# Patient Record
Sex: Female | Born: 1975 | Hispanic: No | Marital: Single | Smoking: Never smoker
Health system: Southern US, Community
[De-identification: ages and names within clinical notes are randomized; demographics above are authoritative.]

## PROBLEM LIST (undated history)

## (undated) DIAGNOSIS — K5090 Crohn's disease, unspecified, without complications: Secondary | ICD-10-CM

## (undated) DIAGNOSIS — D649 Anemia, unspecified: Secondary | ICD-10-CM

## (undated) HISTORY — DX: Crohn's disease, unspecified, without complications: K50.90

## (undated) HISTORY — PX: BREAST ENHANCEMENT SURGERY: SHX7

## (undated) HISTORY — DX: Anemia, unspecified: D64.9

## (undated) HISTORY — PX: OTHER SURGICAL HISTORY: SHX169

---

## 2020-02-02 ENCOUNTER — Encounter (HOSPITAL_BASED_OUTPATIENT_CLINIC_OR_DEPARTMENT_OTHER): Payer: Self-pay | Admitting: Emergency Medicine

## 2020-02-02 ENCOUNTER — Emergency Department (HOSPITAL_BASED_OUTPATIENT_CLINIC_OR_DEPARTMENT_OTHER): Payer: Self-pay

## 2020-02-02 ENCOUNTER — Emergency Department (HOSPITAL_BASED_OUTPATIENT_CLINIC_OR_DEPARTMENT_OTHER)
Admission: EM | Admit: 2020-02-02 | Discharge: 2020-02-02 | Disposition: A | Discharge status: Home / Self Care | Attending: Emergency Medicine | Admitting: Emergency Medicine

## 2020-02-02 ENCOUNTER — Other Ambulatory Visit: Payer: Self-pay

## 2020-02-02 DIAGNOSIS — M79601 Pain in right arm: Secondary | ICD-10-CM

## 2020-02-02 DIAGNOSIS — M79621 Pain in right upper arm: Secondary | ICD-10-CM | POA: Insufficient documentation

## 2020-02-02 MED ORDER — KETOROLAC TROMETHAMINE 60 MG/2ML IM SOLN
15.0000 mg | Freq: Once | INTRAMUSCULAR | Status: AC
  Administered 2020-02-02: 15 mg via INTRAMUSCULAR
  Filled 2020-02-02: qty 2

## 2020-02-02 NOTE — ED Triage Notes (Addendum)
Right sided elbow pain after getting bumped by forklift yesterday at work. States bruising, decreased strength. Aleve 6 hours ago

## 2020-02-02 NOTE — ED Provider Notes (Signed)
[facility] EMERGENCY DEPARTMENT Provider Note   CSN: [id number] Arrival date & time: 02/02/20  0145     History Chief Complaint  Patient presents with  . Arm Pain    Meagan Cooper is a 44 y.o. female.  Right elbow hit with some type of box that was hit by a forklift. Having paresthesias and numbness in area since then. Also with pain.  Better with compression.   Arm Pain This is a new problem. The current episode started 12 to 24 hours ago. The problem occurs constantly. The problem has been gradually worsening. Pertinent negatives include no abdominal pain and no headaches. Nothing aggravates the symptoms. Nothing relieves the symptoms. She has tried rest (compression) for the symptoms. The treatment provided mild relief.    History reviewed. No pertinent past medical history.  There are no problems to display for this patient.   History reviewed. No pertinent surgical history.   OB History   No obstetric history on file.     No family history on file.  Social History   Tobacco Use  . Smoking status: Never Smoker  . Smokeless tobacco: Never Used  Substance Use Topics  . Alcohol use: Not Currently  . Drug use: Not Currently    Home Medications Prior to Admission medications   Not on File    Allergies    Patient has no known allergies.  Review of Systems   Review of Systems  Gastrointestinal: Negative for abdominal pain.  Neurological: Negative for headaches.  All other systems reviewed and are negative.   Physical Exam Updated Vital Signs BP 130/83   Pulse 98   Temp 99.8 F (37.7 C) (Oral)   Resp 18   Ht 5\' 9"  (1.753 m)   Wt 59 kg   LMP 12/24/2019 (Approximate)   SpO2 100%   BMI 19.20 kg/m   Physical Exam Vitals and nursing note reviewed.  Constitutional:      Appearance: She is well-developed.  HENT:     Head: Normocephalic and atraumatic.     Mouth/Throat:     Mouth: Mucous membranes are dry.     Pharynx: Oropharynx is  clear.  Eyes:     Conjunctiva/sclera: Conjunctivae normal.  Cardiovascular:     Rate and Rhythm: Normal rate and regular rhythm.  Pulmonary:     Effort: No respiratory distress.     Breath sounds: No stridor.  Abdominal:     General: There is no distension.  Musculoskeletal:     Cervical back: Normal range of motion.  Skin:    General: Skin is warm and dry.     Findings: Bruising (right distal humerus posterior with small area of abrasion) present.  Neurological:     Mental Status: She is alert.     Comments: Good sensation. Good grip strength. Good tricep extension, bicep flexion.      ED Results / Procedures / Treatments   Labs (all labs ordered are listed, but only abnormal results are displayed) Labs Reviewed - No data to display  EKG None  Radiology DG Elbow Complete Right  Result Date: 02/02/2020 CLINICAL DATA:  Right elbow pain.  Evaluate for fracture. EXAM: RIGHT ELBOW - COMPLETE 3+ VIEW COMPARISON:  None. FINDINGS: There is no evidence of fracture, dislocation, or joint effusion. There is no evidence of arthropathy or other focal bone abnormality. Soft tissues are unremarkable. IMPRESSION: Negative radiographs of the right elbow. Electronically Signed   By: Keith Rake M.D.   On: 02/02/2020  02:30    Procedures Procedures (including critical care time)  Medications Ordered in ED Medications  ketorolac (TORADOL) injection 15 mg (15 mg Intramuscular Given 02/02/20 0250)    ED Course  I have reviewed the triage vital signs and the nursing notes.  Pertinent labs & imaging results that were available during my care of the patient were reviewed by me and considered in my medical decision making (see chart for details).    MDM Rules/Calculators/A&P                      Suspect area of hematoma is causing peripheral (ulnar) nerve irritation/compression causing discomfort. No e/o cva. Good strength and sensation, doubt nerve injury. Plan for supportive care,  light duty for a week, follow up for return to work.  Final Clinical Impression(s) / ED Diagnoses Final diagnoses:  Right arm pain    Rx / DC Orders ED Discharge Orders    None       Kenny Stern, Barbara Cower, MD 02/02/20 0320

## 2020-02-15 ENCOUNTER — Ambulatory Visit: Payer: Self-pay

## 2020-02-15 ENCOUNTER — Ambulatory Visit (INDEPENDENT_AMBULATORY_CARE_PROVIDER_SITE_OTHER): Admitting: Family Medicine

## 2020-02-15 ENCOUNTER — Other Ambulatory Visit: Payer: Self-pay

## 2020-02-15 DIAGNOSIS — M7711 Lateral epicondylitis, right elbow: Secondary | ICD-10-CM

## 2020-02-15 DIAGNOSIS — S5000XA Contusion of unspecified elbow, initial encounter: Secondary | ICD-10-CM | POA: Insufficient documentation

## 2020-02-15 DIAGNOSIS — M25521 Pain in right elbow: Secondary | ICD-10-CM

## 2020-02-15 DIAGNOSIS — M7710 Lateral epicondylitis, unspecified elbow: Secondary | ICD-10-CM | POA: Insufficient documentation

## 2020-02-15 DIAGNOSIS — S5001XA Contusion of right elbow, initial encounter: Secondary | ICD-10-CM | POA: Diagnosis not present

## 2020-02-15 MED ORDER — DICLOFENAC SODIUM 1 % EX GEL: 2.0000 g | Freq: Four times a day (QID) | CUTANEOUS | 11 refills | Status: DC

## 2020-02-15 MED ORDER — PREDNISONE 5 MG PO TABS: ORAL_TABLET | ORAL | 0 refills | Status: DC

## 2020-02-15 NOTE — Patient Instructions (Signed)
Nice to meet you Please try the exercises  Please try the voltaren  You can try heat or ice  Please send me a message in MyChart with any questions or updates.  Please see me back in 4 weeks.   --Dr. Jordan Likes

## 2020-02-15 NOTE — Assessment & Plan Note (Addendum)
Injury occurred after an accident at work.  No structural changes appreciated on ultrasound or x-ray today.  Possible for bony contusion or exacerbation of epdicondylitis.  Has good range of motion. -Counseled on home exercise therapy and supportive care. -Prednisone -Voltaren. -If no improvement will consider injection or physical therapy.

## 2020-02-15 NOTE — Progress Notes (Addendum)
  Meagan Cooper - 44 y.o. female MRN [id number]  Date of birth: [birth year]  SUBJECTIVE:  Including CC & ROS.  No chief complaint on file.   Meagan Cooper is a 44 y.o. female that is presenting with right elbow pain.  She was at work at the [employer] and a shelf hit her elbow.  She had significant pain over this aspect.  She was seen in the emergency department and placed in a sling.  She feels like the sling is hurting.  No medicines have really improved her pain at this point..  Independent review of the right elbow x-ray from 4/23 shows no acute changes.   Review of Systems See HPI   HISTORY: Past Medical, Surgical, Social, and Family History Reviewed & Updated per EMR.   Pertinent Historical Findings include:  No past medical history on file.  No past surgical history on file.  No family history on file.  Social History   Socioeconomic History  . Marital status: Cooper    Spouse name: Not on file  . Number of children: Not on file  . Years of education: Not on file  . Highest education level: Not on file  Occupational History  . Not on file  Tobacco Use  . Smoking status: Never Smoker  . Smokeless tobacco: Never Used  Substance and Sexual Activity  . Alcohol use: Not Currently  . Drug use: Not Currently  . Sexual activity: Not on file  Other Topics Concern  . Not on file  Social History Narrative  . Not on file   Social Determinants of Health   Financial Resource Strain: Not on file  Food Insecurity: Not on file  Transportation Needs: Not on file  Physical Activity: Not on file  Stress: Not on file  Social Connections: Not on file  Intimate Partner Violence: Not on file     PHYSICAL EXAM:  VS: There were no vitals taken for this visit. Physical Exam Gen: NAD, alert, cooperative with exam, well-appearing MSK:  Right elbow: No obvious effusion. Normal range of motion. Normal supination pronation. Tenderness palpation over the lateral joint line. No  redness. Neurovascularly intact  Limited ultrasound: Right elbow:  No effusion within the joint appreciated. Normal-appearing lateral epicondyle. No changes at the radial head. No changes at the triceps insertion. No changes of the olecranon.  Summary: No structural changes appreciated  Ultrasound and interpretation by Clare Gandy, MD    ASSESSMENT & PLAN:   Lateral epicondylitis Injury occurred after an accident at work.  No structural changes appreciated on ultrasound or x-ray today.  Possible for bony contusion or exacerbation of epdicondylitis.  Has good range of motion. -Counseled on home exercise therapy and supportive care. -Prednisone -Voltaren. -If no improvement will consider injection or physical therapy.

## 2020-03-06 ENCOUNTER — Encounter: Payer: Self-pay | Admitting: Family Medicine

## 2020-03-14 ENCOUNTER — Encounter: Payer: Self-pay | Admitting: Family Medicine

## 2020-03-18 ENCOUNTER — Encounter: Payer: Self-pay | Admitting: Family Medicine

## 2020-03-18 ENCOUNTER — Other Ambulatory Visit: Payer: Self-pay

## 2020-03-18 ENCOUNTER — Ambulatory Visit (INDEPENDENT_AMBULATORY_CARE_PROVIDER_SITE_OTHER): Admitting: Family Medicine

## 2020-03-18 VITALS — BP 120/78 | HR 76 | Ht 69.0 in | Wt 130.0 lb

## 2020-03-18 DIAGNOSIS — M7711 Lateral epicondylitis, right elbow: Secondary | ICD-10-CM | POA: Diagnosis not present

## 2020-03-18 DIAGNOSIS — M25521 Pain in right elbow: Secondary | ICD-10-CM | POA: Diagnosis not present

## 2020-03-18 DIAGNOSIS — S5001XD Contusion of right elbow, subsequent encounter: Secondary | ICD-10-CM

## 2020-03-18 MED ORDER — GABAPENTIN 100 MG PO CAPS: 100.0000 mg | ORAL_CAPSULE | Freq: Three times a day (TID) | ORAL | 1 refills | Status: DC | PRN

## 2020-03-18 NOTE — Assessment & Plan Note (Signed)
Pain is secondary to an injury sustained at work.  Symptoms are ongoing.  A possible for radial nerve versus ulnar nerve involvement.  She was hit on the lateral aspect of the elbow. -Counseled on home exercise therapy and supportive care. -MRI to evaluate for possible ligamentous or neurovascular changes. -Provided work note.

## 2020-03-18 NOTE — Patient Instructions (Signed)
Good to see you Please try the gabapentin. This may make you sleepy. Please start with one pill at night. You can increase to 2 or 3 pills per day as you tolerate   Please send me a message in MyChart with any questions or updates.  We will set up a virtual visit once the MRI is resulted.   --Dr. Jordan Likes

## 2020-03-18 NOTE — Progress Notes (Addendum)
  Para Cossey - 44 y.o. female MRN [id number]  Date of birth: December 23, 1975  SUBJECTIVE:  Including CC & ROS.  Chief Complaint  Patient presents with  . Follow-up    right elbow    Meagan Cooper is a 44 y.o. female that is presenting with worsening of her right elbow pain.  Her injury stems from an accident that occurred at work where her right elbow was struck with a shelf.  Imaging thus far has been negative.  The pain is occurring over the posterior aspect as well as the lateral aspect.  Having some sensational changes in the pinky finger.   Review of Systems See HPI   HISTORY: Past Medical, Surgical, Social, and Family History Reviewed & Updated per EMR.   Pertinent Historical Findings include:  No past medical history on file.  No past surgical history on file.  No family history on file.  Social History   Socioeconomic History  . Marital status: Single    Spouse name: Not on file  . Number of children: Not on file  . Years of education: Not on file  . Highest education level: Not on file  Occupational History  . Not on file  Tobacco Use  . Smoking status: Never Smoker  . Smokeless tobacco: Never Used  Substance and Sexual Activity  . Alcohol use: Not Currently  . Drug use: Not Currently  . Sexual activity: Not on file  Other Topics Concern  . Not on file  Social History Narrative  . Not on file   Social Determinants of Health   Financial Resource Strain: Not on file  Food Insecurity: Not on file  Transportation Needs: Not on file  Physical Activity: Not on file  Stress: Not on file  Social Connections: Not on file  Intimate Partner Violence: Not on file     PHYSICAL EXAM:  VS: BP 120/78   Pulse 76   Ht 5\' 9"  (1.753 m)   Wt 130 lb (59 kg)   BMI 19.20 kg/m  Physical Exam Gen: NAD, alert, cooperative with exam, well-appearing MSK:  Right elbow: Normal range of motion. Tenderness to palpation over the lateral joint space. Positive Tinel's at the  cubital tunnel. Tenderness palpation over the radial nerve at the elbow. Normal grip strength. Normal finger abduction and adduction  Normal pincer grasp. Normal wrist strength to flexion extension. Neurovascularly intact     ASSESSMENT & PLAN:   Lateral epicondylitis Pain is secondary to an injury sustained at work.  Symptoms are ongoing.  A possible for radial nerve versus ulnar nerve involvement.  She was hit on the lateral aspect of the elbow. -Counseled on home exercise therapy and supportive care. -MRI to evaluate for possible ligamentous or neurovascular changes. -Provided work note.

## 2020-03-25 ENCOUNTER — Telehealth: Payer: Self-pay | Admitting: Family Medicine

## 2020-03-25 NOTE — Telephone Encounter (Signed)
Patient calling regarding the gabapenitn that was prescribed. She states the medication makes her drowsy and nauseas and has caused her eye to twitch. Patient asking if there is anything else that can be prescribed.

## 2020-03-26 MED ORDER — NAPROXEN 500 MG PO TABS: 500.0000 mg | ORAL_TABLET | Freq: Two times a day (BID) | ORAL | 1 refills | Status: AC | PRN

## 2020-03-26 NOTE — Telephone Encounter (Signed)
She did not tolerate the gabapentin. Will send in naproxen.   Myra Rude, MD [facility] 03/26/2020, 8:22 AM

## 2020-04-10 NOTE — Addendum Note (Signed)
Addended by: Kathi Simpers F on: 04/10/2020 04:38 PM   Modules accepted: Orders

## 2020-04-22 ENCOUNTER — Ambulatory Visit
Admission: RE | Admit: 2020-04-22 | Discharge: 2020-04-22 | Disposition: A | Discharge status: Home / Self Care | Payer: Self-pay | Source: Ambulatory Visit | Attending: Family Medicine | Admitting: Family Medicine

## 2020-04-22 ENCOUNTER — Other Ambulatory Visit: Payer: Self-pay

## 2020-04-22 DIAGNOSIS — M25521 Pain in right elbow: Secondary | ICD-10-CM

## 2020-05-02 ENCOUNTER — Telehealth: Payer: Self-pay | Admitting: *Deleted

## 2020-05-02 NOTE — Telephone Encounter (Signed)
Called patient to inform her that Dr Jordan Likes was on vacation and that Dr Margaretha Sheffield looked at her MRI. Informed her MRI showed she has tennis elbow with partial tearing of the tendon. Told patientshe will need to keep her follow-up appt with Dr. Jordan Likes next thurs 7/29 to discuss next steps. Patient agreed to do so.

## 2020-05-09 ENCOUNTER — Telehealth: Payer: Self-pay | Admitting: Family Medicine

## 2020-10-17 DIAGNOSIS — Z131 Encounter for screening for diabetes mellitus: Secondary | ICD-10-CM | POA: Diagnosis not present

## 2020-10-17 DIAGNOSIS — Z0000 Encounter for general adult medical examination without abnormal findings: Secondary | ICD-10-CM | POA: Diagnosis not present

## 2020-10-17 DIAGNOSIS — K5090 Crohn's disease, unspecified, without complications: Secondary | ICD-10-CM | POA: Diagnosis not present

## 2020-10-17 DIAGNOSIS — Z13220 Encounter for screening for lipoid disorders: Secondary | ICD-10-CM | POA: Diagnosis not present

## 2020-10-24 ENCOUNTER — Telehealth: Payer: Self-pay | Admitting: Family Medicine

## 2020-10-24 NOTE — Telephone Encounter (Signed)
Patient called states she received a letter frm the [employer] regarding her WC claim pmts about to be retracked due to a Dx of Pain (not being acceptable)-- Pt request provider review & amend Dx for DOS.  --Forwarding request to provider (a printed copy of DOL (dept of Labor )  Denial letter placed in provider basket for review   --glh

## 2020-10-25 ENCOUNTER — Telehealth: Payer: Self-pay | Admitting: Family Medicine

## 2020-10-25 NOTE — Telephone Encounter (Signed)
Patient advised provider amended records--glh

## 2020-10-25 NOTE — Telephone Encounter (Signed)
Completed paperwork.   Myra Rude, MD [facility] 10/25/2020, 9:40 AM

## 2020-11-08 ENCOUNTER — Ambulatory Visit (INDEPENDENT_AMBULATORY_CARE_PROVIDER_SITE_OTHER): Payer: Federal, State, Local not specified - PPO | Admitting: Family Medicine

## 2020-11-08 ENCOUNTER — Other Ambulatory Visit: Payer: Self-pay

## 2020-11-08 DIAGNOSIS — M7711 Lateral epicondylitis, right elbow: Secondary | ICD-10-CM

## 2020-11-08 NOTE — Progress Notes (Signed)
  Meagan Cooper - 45 y.o. female MRN [id number]  Date of birth: [birth year]  SUBJECTIVE:  Including CC & ROS.  No chief complaint on file.   Meagan Cooper is a 45 y.o. female that is following up for her injury she sustained at work.  She has imaging performed.  She has had ongoing pain since her initial injury.   Review of Systems See HPI   HISTORY: Past Medical, Surgical, Social, and Family History Reviewed & Updated per EMR.   Pertinent Historical Findings include:  No past medical history on file.  No past surgical history on file.  No family history on file.  Social History   Socioeconomic History  . Marital status: Single    Spouse name: Not on file  . Number of children: Not on file  . Years of education: Not on file  . Highest education level: Not on file  Occupational History  . Not on file  Tobacco Use  . Smoking status: Never Smoker  . Smokeless tobacco: Never Used  Substance and Sexual Activity  . Alcohol use: Not Currently  . Drug use: Not Currently  . Sexual activity: Not on file  Other Topics Concern  . Not on file  Social History Narrative  . Not on file   Social Determinants of Health   Financial Resource Strain: Not on file  Food Insecurity: Not on file  Transportation Needs: Not on file  Physical Activity: Not on file  Stress: Not on file  Social Connections: Not on file  Intimate Partner Violence: Not on file     PHYSICAL EXAM:  VS: BP 126/78   Ht 5\' 9"  (1.753 m)   Wt 135 lb (61.2 kg)   BMI 19.94 kg/m  Physical Exam Gen: NAD, alert, cooperative with exam, well-appearing   ASSESSMENT & PLAN:   Lateral epicondylitis Pain secondary to an injury at work.  Imaging has showed changes at the lateral epicondyle.  Still having ongoing pain. -Counseled on home exercise therapy and supportive care. -Could consider injection, physical therapy or referral for surgery.

## 2020-11-08 NOTE — Assessment & Plan Note (Signed)
Pain secondary to an injury at work.  Imaging has showed changes at the lateral epicondyle.  Still having ongoing pain. -Counseled on home exercise therapy and supportive care. -Could consider injection, physical therapy or referral for surgery.

## 2020-11-10 ENCOUNTER — Encounter: Payer: Self-pay | Admitting: Family Medicine

## 2020-11-12 ENCOUNTER — Encounter: Payer: Self-pay | Admitting: Family Medicine

## 2020-12-13 DIAGNOSIS — Z01419 Encounter for gynecological examination (general) (routine) without abnormal findings: Secondary | ICD-10-CM | POA: Diagnosis not present

## 2020-12-13 DIAGNOSIS — Z1231 Encounter for screening mammogram for malignant neoplasm of breast: Secondary | ICD-10-CM | POA: Diagnosis not present

## 2020-12-13 DIAGNOSIS — N920 Excessive and frequent menstruation with regular cycle: Secondary | ICD-10-CM | POA: Diagnosis not present

## 2020-12-13 DIAGNOSIS — Z6822 Body mass index (BMI) 22.0-22.9, adult: Secondary | ICD-10-CM | POA: Diagnosis not present

## 2020-12-18 ENCOUNTER — Other Ambulatory Visit (INDEPENDENT_AMBULATORY_CARE_PROVIDER_SITE_OTHER): Payer: Federal, State, Local not specified - PPO

## 2020-12-18 ENCOUNTER — Encounter: Payer: Self-pay | Admitting: Gastroenterology

## 2020-12-18 ENCOUNTER — Ambulatory Visit (INDEPENDENT_AMBULATORY_CARE_PROVIDER_SITE_OTHER): Payer: Federal, State, Local not specified - PPO | Admitting: Gastroenterology

## 2020-12-18 VITALS — BP 94/60 | HR 80 | Ht 68.0 in | Wt 150.2 lb

## 2020-12-18 DIAGNOSIS — K5909 Other constipation: Secondary | ICD-10-CM

## 2020-12-18 DIAGNOSIS — Z1211 Encounter for screening for malignant neoplasm of colon: Secondary | ICD-10-CM | POA: Diagnosis not present

## 2020-12-18 DIAGNOSIS — R140 Abdominal distension (gaseous): Secondary | ICD-10-CM

## 2020-12-18 DIAGNOSIS — K649 Unspecified hemorrhoids: Secondary | ICD-10-CM

## 2020-12-18 DIAGNOSIS — R1084 Generalized abdominal pain: Secondary | ICD-10-CM

## 2020-12-18 DIAGNOSIS — Z8719 Personal history of other diseases of the digestive system: Secondary | ICD-10-CM

## 2020-12-18 LAB — CBC
HCT: 36.9 % (ref 36.0–46.0)
Hemoglobin: 11.9 g/dL — ABNORMAL LOW (ref 12.0–15.0)
MCHC: 32.3 g/dL (ref 30.0–36.0)
MCV: 80.7 fl (ref 78.0–100.0)
Platelets: 263 10*3/uL (ref 150.0–400.0)
RBC: 4.57 Mil/uL (ref 3.87–5.11)
RDW: 14 % (ref 11.5–15.5)
WBC: 5.8 10*3/uL (ref 4.0–10.5)

## 2020-12-18 LAB — BASIC METABOLIC PANEL
BUN: 9 mg/dL (ref 6–23)
CO2: 30 mEq/L (ref 19–32)
Calcium: 9.2 mg/dL (ref 8.4–10.5)
Chloride: 104 mEq/L (ref 96–112)
Creatinine, Ser: 0.72 mg/dL (ref 0.40–1.20)
GFR: 100.55 mL/min (ref >60.00)
Glucose, Bld: 80 mg/dL (ref 70–99)
Potassium: 3.8 mEq/L (ref 3.5–5.1)
Sodium: 137 mEq/L (ref 135–145)

## 2020-12-18 LAB — SEDIMENTATION RATE: Sed Rate: 23 mm/hr — ABNORMAL HIGH (ref 0–20)

## 2020-12-18 LAB — TSH: TSH: 0.64 u[IU]/mL (ref 0.35–4.50)

## 2020-12-18 LAB — HIGH SENSITIVITY CRP: CRP, High Sensitivity: 0.43 mg/L (ref 0.000–5.000)

## 2020-12-18 MED ORDER — PLENVU 140 G PO SOLR: 1.0000 | ORAL | 0 refills | Status: AC

## 2020-12-18 NOTE — Progress Notes (Signed)
[place] VISIT   Primary Care Provider Marco Collie, [place] [id number] [phone]  Referring Provider Marco Collie, MD [address] [place],  [place] [address] [phone]  Patient Profile: Meagan Cooper is a 45 y.o. female with a pmh significant for ?Crohn's disease, chronic anemia and chronic constipation.  The patient presents to the [facility] for an evaluation and management of problem(s) noted below:  Problem List 1. Unclear history of Crohn's disease   2. Chronic constipation   3. Colon cancer screening   4. Generalized abdominal pain   5. Bloating symptom   6. Hemorrhoids, unspecified hemorrhoid type     History of Present Illness This is the patient's first visit to the outpatient [facility] clinic.  The patient reports a prior history in the [place] of being diagnosed with Crohn's disease.  She says that since her early 76s, she was dealing with a possible diagnosis of irritable bowel syndrome-constipation.  She tried different fibers as well as stool softeners and eventually to the laxatives in an effort of trying to help her pass a bowel movement.  She currently takes MiraLAX as well as Ex-Lax every 3 to 4 days in order to try and have a bowel movement.  She says at 1 point in time she developed a bowel obstruction which eventually led to a colonoscopy which was reported as normal but she was given a diagnosis of Crohn's disease.  She remembers being initiated on an oral medication, she believes Amitiza, for the treatment of her Crohn's disease.  She lost her insurance and stopped taking this medication.  She has not seen GI in many years but reports a colonoscopy within the last 5 years..  Has experienced no unintentional weight loss.  She has episodes of nausea as well as vomiting.  She has had abdominal bloating, which occurs more often towards the third or fourth  day that she has not had a bowel movement.  Having a bowel movement does relieve her discomfort but does not relieve it completely every time.  She has had notation of hemorrhoids as well maternal grandmother who has a history of Crohn's disease and her daughter has Hirschsprung's disease.  Irritable bowel syndrome does run in her family.  The patient does not take nonsteroidals on an as-needed basis but not daily.  The patient has never had an upper endoscopy.  She does experience joint pains in elbows as well as her other joints but not specifically in her PIP or DIP joints.  She has never seen a rheumatologist.  She believes that these are a result of her underlying Crohn's disease, even though she is experiencing issues with chronic constipation.  She has never seen a rheumatologist.  GI Review of Systems Positive as above Negative for dysphagia, odynophagia, change in bowel habits, melena, hematochezia  Review of Systems General: Denies fevers/chills HEENT: Denies oral lesions Cardiovascular: Denies chest pain/palpitations Pulmonary: Denies shortness of breath Gastroenterological: See HPI Genitourinary: Denies darkened urine or hematuria Hematological: Denies easy bruising/bleeding Endocrine: Denies temperature intolerance Dermatological: Denies jaundice Psychological: Mood is stable but hopeful that we can understand her symptoms further Musculoskeletal: Denies new arthralgias though has recurrent chronic arthralgias   Medications Current Outpatient Medications  Medication Sig Dispense Refill  . naproxen (NAPROSYN) 500 MG tablet Take 1 tablet (500 mg total) by mouth 2 (two) times daily as needed. 60 tablet 1  . norethindrone (MICRONOR) 0.35 MG tablet Take  1 tablet by mouth daily.    Marland Kitchen PEG-KCl-NaCl-NaSulf-Na Asc-C (PLENVU) 140 g SOLR Take 1 kit by mouth as directed. Use coupon: BIN: [id number] PNC: CNRX Group: [id number] ID: [id number] 1 each 0  . predniSONE (DELTASONE) 5 MG tablet Take 6  pills for first day, 5 pills second day, 4 pills third day, 3 pills fourth day, 2 pills the fifth day, and 1 pill sixth day. 21 tablet 0  . diclofenac Sodium (VOLTAREN) 1 % GEL Apply 2 g topically 4 (four) times daily. To affected joint. 100 g 11   No current facility-administered medications for this visit.    Allergies No Known Allergies  Histories Past Medical History:  Diagnosis Date  . Anemia   . Crohn's disease [facility])    Past Surgical History:  Procedure Laterality Date  . BREAST ENHANCEMENT SURGERY Bilateral   . CESAREAN SECTION     x 2  . TUMMY TUCK     Social History   Socioeconomic History  . Marital status: Single    Spouse name: Not on file  . Number of children: 8  . Years of education: Not on file  . Highest education level: Not on file  Occupational History  . Occupation: Tour manager  Tobacco Use  . Smoking status: Never Smoker  . Smokeless tobacco: Never Used  Vaping Use  . Vaping Use: Never used  Substance and Sexual Activity  . Alcohol use: Yes    Comment: occasional  . Drug use: Not Currently  . Sexual activity: Not on file  Other Topics Concern  . Not on file  Social History Narrative  . Not on file   Social Determinants of Health   Financial Resource Strain: Not on file  Food Insecurity: Not on file  Transportation Needs: Not on file  Physical Activity: Not on file  Stress: Not on file  Social Connections: Not on file  Intimate Partner Violence: Not on file   Family History  Problem Relation Age of Onset  . Irritable bowel syndrome Mother   . Neurofibromatosis Son   . Other Daughter        GI problem  . Crohn's disease Maternal Grandmother   . Colon polyps Neg Hx   . Esophageal cancer Neg Hx   . Liver disease Neg Hx   . Pancreatic cancer Neg Hx   . Rectal cancer Neg Hx   . Stomach cancer Neg Hx    I have reviewed her medical, social, and family history in detail and updated the electronic medical record as necessary.     PHYSICAL EXAMINATION  BP 94/60 (BP Location: Left Arm, Patient Position: Sitting, Cuff Size: Normal)   Pulse 80   Ht _0  (1.727 m) Comment: height measured without shoes  Wt 150 lb 4 oz (68.2 kg)   LMP 12/16/2020   BMI 22.85 kg/m  Wt Readings from Last 3 Encounters:  12/23/20 145 lb (65.8 kg)  12/18/20 150 lb 4 oz (68.2 kg)  11/08/20 135 lb (61.2 kg)  GEN: NAD, appears stated age, doesn't appear chronically ill PSYCH: Cooperative, without pressured speech EYE: Conjunctivae pink, sclerae anicteric ENT: MMM CV: RR without R/Gs  RESP: CTAB posteriorly, without wheezing GI: NABS, soft, tenderness to palpation throughout abdomen, ND, without rebound or guarding, no HSM appreciated MSK/EXT: No lower extremity edema SKIN: No jaundice NEURO:  Alert & Oriented x 3, no focal deficits   REVIEW OF DATA  I reviewed the following data at the time of this encounter:  GI Procedures and Studies  Reported colonoscopy in [place] which we will try to get records for  Laboratory Studies  No relevant studies to review chart  Imaging Studies  No relevant studies to review   ASSESSMENT  Ms. Reisner is a 45 y.o. female with a pmh significant for ?Crohn's disease, chronic anemia and chronic constipation.  The patient is seen today for evaluation and management of:  1. Unclear history of Crohn's disease   2. Chronic constipation   3. Colon cancer screening   4. Generalized abdominal pain   5. Bloating symptom   6. Hemorrhoids, unspecified hemorrhoid type    The patient is hemodynamically stable.  Clinically, she has a longstanding history of chronic constipation.  She describes a diagnosis of Crohn's disease although reports a normal colonoscopy previously.  There is a history of a potential prior small bowel obstruction and so certainly the consideration of whether the patient was having a severe terminal ileal inflammation that subsequently led to a bowel junction could be considered  but chronic constipation issues seems very hard for me to understand this possible diagnosis of Crohn's disease when she reports a normal colonoscopy otherwise.  We will try to get records from her previous gastroenterologist.  At this point in time, she is due for colon cancer screening and antiacids so a repeat colonoscopy is recommended.  We are going to give her samples of Linzess 290 mcg daily to see if this works for her.  If it does then we can try to get a prescription sent off for her.  The risks and benefits of endoscopic evaluation were discussed with the patient; these include but are not limited to the risk of perforation, infection, bleeding, missed lesions, lack of diagnosis, severe illness requiring hospitalization, as well as anesthesia and sedation related illnesses.  The patient is agreeable to proceed.  We will obtain some laboratories today to further evaluate the patient's symptoms and see if any elevation in inflammatory markers are found.  All patient questions were answered to the best of my ability, and the patient agrees to the aforementioned plan of action with follow-up as indicated.   PLAN  Laboratories as outlined below Linzess 290 mcg daily samples provided to patient to see if this will help Colonoscopy for screening purposes (we will also evaluate to see if any evidence of gross disease is present) Consider cross-sectional imaging to evaluate terminal ileum or small bowel in case colonoscopy is normal in appearance Rheumatology referral for evaluation of arthropathy/arthritis (she had presumed this was a result of Crohn's disease" but is not clear to me that it is) Follow-up with sports medicine as well was been working with her for issues of joint pains   Orders Placed This Encounter  Procedures  . CBC  . Basic Metabolic Panel (BMET)  . Calcium, ionized  . TSH  . Sedimentation rate  . CRP High sensitivity  . Ambulatory referral to Gastroenterology    New  Prescriptions   PEG-KCL-NACL-NASULF-NA ASC-C (PLENVU) 140 G SOLR    Take 1 kit by mouth as directed. Use coupon: BIN: [id number] PNC: CNRX Group: [id number] ID: [id number]   Modified Medications   Modified Medication Previous Medication   DICLOFENAC SODIUM (VOLTAREN) 1 % GEL diclofenac Sodium (VOLTAREN) 1 % GEL      Apply 2 g topically 4 (four) times daily. To affected joint.    Apply 2 g topically 4 (four) times daily. To affected joint.    Planned Follow Up  No follow-ups on file.   Total Time in Face-to-Face and in Coordination of Care for patient including independent/personal interpretation/review of prior testing, medical history, examination, medication adjustment, communicating results with the patient directly, and documentation with the EHR is 45 minutes.   Justice Britain, MD [facility] Office # [phone]

## 2020-12-18 NOTE — Patient Instructions (Signed)
Your provider has requested that you go to the basement level for lab work before leaving today. Press "B" on the elevator. The lab is located at the first door on the left as you exit the elevator.  We have sent the following medications to your pharmacy for you to pick up at your convenience: Plenvu  START: Linzess - Take 1 capsules daily. ( samples given to you today.) If Linzess works for you, then contact office and will send prescription to your pharmacy at that time.  Due to recent changes in healthcare laws, you may see the results of your imaging and laboratory studies on MyChart before your provider has had a chance to review them.  We understand that in some cases there may be results that are confusing or concerning to you. Not all laboratory results come back in the same time frame and the provider may be waiting for multiple results in order to interpret others.  Please give [place] 48 hours in order for your provider to thoroughly review all the results before contacting the office for clarification of your results.   You have been scheduled for a colonoscopy. Please follow written instructions given to you at your visit today.  Please pick up your prep supplies at the pharmacy within the next 1-3 days. If you use inhalers (even only as needed), please bring them with you on the day of your procedure.   If you are age 62 or younger, your body mass index should be between 19-25. Your Body mass index is 22.85 kg/m. If this is out of the aformentioned range listed, please consider follow up with your Primary Care Provider.   We will try to obtain your records from [place].   Thank you for choosing me and [facility].  Dr. Meridee Score

## 2020-12-19 ENCOUNTER — Telehealth (INDEPENDENT_AMBULATORY_CARE_PROVIDER_SITE_OTHER): Payer: Federal, State, Local not specified - PPO | Admitting: Family Medicine

## 2020-12-19 DIAGNOSIS — M7711 Lateral epicondylitis, right elbow: Secondary | ICD-10-CM | POA: Diagnosis not present

## 2020-12-19 LAB — CALCIUM, IONIZED: Calcium, Ion: 5.32 mg/dL (ref 4.8–5.6)

## 2020-12-19 MED ORDER — DICLOFENAC SODIUM 1 % EX GEL: 2.0000 g | Freq: Four times a day (QID) | CUTANEOUS | 11 refills | Status: AC

## 2020-12-19 NOTE — Progress Notes (Signed)
Virtual Visit via Telephone Note  I connected with Meagan Cooper on 12/19/20 at 11:30 AM EST by telephone and verified that I am speaking with the correct person using two identifiers.  Location: Patient: home Provider: office   I discussed the limitations, risks, security and privacy concerns of performing an evaluation and management service by telephone and the availability of in person appointments. I also discussed with the patient that there may be a patient responsible charge related to this service. The patient expressed understanding and agreed to proceed.   History of Present Illness:  Meagan Cooper is following up for her lateral epicondylitis that occurred after an injury at work. She reports pain improved with the topical medication.    Observations/Objective:   Assessment and Plan:  Lateral epicondylitis of the right elbow: Pain still occurring after an injury sustained at work.  She did get improvement with topical medication. -Counseled on home exercise therapy and supportive care. -Voltaren -Can consider injection.  Follow Up Instructions:    I discussed the assessment and treatment plan with the patient. The patient was provided an opportunity to ask questions and all were answered. The patient agreed with the plan and demonstrated an understanding of the instructions.   The patient was advised to call back or seek an in-person evaluation if the symptoms worsen or if the condition fails to improve as anticipated.  I provided 5 minutes of non-face-to-face time during this encounter.   Clare Gandy, MD

## 2020-12-19 NOTE — Assessment & Plan Note (Signed)
Pain still occurring after an injury sustained at work.  She did get improvement with topical medication. -Counseled on home exercise therapy and supportive care. -Voltaren -Can consider injection.

## 2020-12-20 ENCOUNTER — Telehealth: Payer: Self-pay | Admitting: Gastroenterology

## 2020-12-20 DIAGNOSIS — M1990 Unspecified osteoarthritis, unspecified site: Secondary | ICD-10-CM

## 2020-12-20 DIAGNOSIS — M129 Arthropathy, unspecified: Secondary | ICD-10-CM

## 2020-12-20 NOTE — Telephone Encounter (Signed)
Please advise on referral

## 2020-12-20 NOTE — Telephone Encounter (Signed)
She was seen in the office please route to the CMA

## 2020-12-20 NOTE — Telephone Encounter (Signed)
[facility] Service Team is requesting a call back to discuss possible alternatives for the pt's PLENVU since this is not covered by the pt's insurance.  CB [phone]

## 2020-12-20 NOTE — Telephone Encounter (Signed)
Patient called and states Dr. Meridee Score said he would refer her to see a Rheumatologist but she did not see it via MyChart

## 2020-12-20 NOTE — Telephone Encounter (Signed)
Patient will be given a sample of Plenvu. Pt has been informed.

## 2020-12-22 NOTE — Telephone Encounter (Signed)
Rheumatology referral for arthropathy/arthritis work up and management.  Thank you. GM

## 2020-12-23 ENCOUNTER — Other Ambulatory Visit: Payer: Self-pay

## 2020-12-23 ENCOUNTER — Ambulatory Visit: Payer: Self-pay

## 2020-12-23 ENCOUNTER — Ambulatory Visit (INDEPENDENT_AMBULATORY_CARE_PROVIDER_SITE_OTHER): Admitting: Family Medicine

## 2020-12-23 VITALS — BP 90/62 | Ht 69.0 in | Wt 145.0 lb

## 2020-12-23 DIAGNOSIS — M7711 Lateral epicondylitis, right elbow: Secondary | ICD-10-CM

## 2020-12-23 DIAGNOSIS — S60552A Superficial foreign body of left hand, initial encounter: Secondary | ICD-10-CM | POA: Diagnosis not present

## 2020-12-23 MED ORDER — METHYLPREDNISOLONE ACETATE 40 MG/ML IJ SUSP
40.0000 mg | Freq: Once | INTRAMUSCULAR | Status: AC
  Administered 2020-12-23: 40 mg via INTRA_ARTICULAR

## 2020-12-23 NOTE — Patient Instructions (Addendum)
Good to see you Please continue the exercises  Please use ice as needed  Please send me a message in MyChart with any questions or updates.  Please see me back in 4-6 weeks.   --Dr. Jordan Likes   Dr Darrelyn Hillock [facility] [address] [place] [address] 12/27/2020 @ 230p Phone: [phone]

## 2020-12-23 NOTE — Assessment & Plan Note (Signed)
Acute on chronic in nature.  Occurred while she was at work.  Limited improvement thus far.  MRI was revealing for changes at the origin of the lateral epicondyle -Counseled on home exercise therapy supportive care. - injection.

## 2020-12-23 NOTE — Progress Notes (Signed)
Meagan Cooper - 45 y.o. female MRN [id number]  Date of birth: 24-Jun-1976  SUBJECTIVE:  Including CC & ROS.  No chief complaint on file.   Meagan Cooper is a 45 y.o. female that is presenting with worsening of her right elbow pain.  Has been ongoing since her incident at work.  She also presents with left hand pain.  She feels a nodule in the palmar aspect.   Review of Systems See HPI   HISTORY: Past Medical, Surgical, Social, and Family History Reviewed & Updated per EMR.   Pertinent Historical Findings include:  Past Medical History:  Diagnosis Date  . Anemia   . Crohn's disease [facility])     Past Surgical History:  Procedure Laterality Date  . BREAST ENHANCEMENT SURGERY Bilateral   . CESAREAN SECTION     x 2  . TUMMY TUCK      Family History  Problem Relation Age of Onset  . Irritable bowel syndrome Mother   . Neurofibromatosis Son   . Other Daughter        GI problem    Social History   Socioeconomic History  . Marital status: Single    Spouse name: Not on file  . Number of children: 8  . Years of education: Not on file  . Highest education level: Not on file  Occupational History  . Occupation: Paramedic  Tobacco Use  . Smoking status: Never Smoker  . Smokeless tobacco: Never Used  Vaping Use  . Vaping Use: Never used  Substance and Sexual Activity  . Alcohol use: Yes    Comment: occasional  . Drug use: Not Currently  . Sexual activity: Not on file  Other Topics Concern  . Not on file  Social History Narrative  . Not on file   Social Determinants of Health   Financial Resource Strain: Not on file  Food Insecurity: Not on file  Transportation Needs: Not on file  Physical Activity: Not on file  Stress: Not on file  Social Connections: Not on file  Intimate Partner Violence: Not on file     PHYSICAL EXAM:  VS: BP 90/62   Ht 5\' 9"  (1.753 m)   Wt 145 lb (65.8 kg)   LMP 12/16/2020   BMI 21.41 kg/m  Physical Exam Gen: NAD, alert,  cooperative with exam, well-appearing MSK:  Left hand: Nodule appreciated on the palmar aspect of the fourth flexor tendon. No swelling or ecchymosis. No triggering. Neurovascular intact  Limited ultrasound: Left hand:  There appears to be hyperechoic change near the subcutaneous with no specific identified structure.  No surrounding hyperemia or edema.  Summary: Foreign object in the palmar aspect of the superficial left pain  Ultrasound and interpretation by 02/15/2021, MD   Aspiration/Injection Procedure Note Meagan Cooper 1976-06-01  Procedure: Injection Indications: Right elbow pain  Procedure Details Consent: Risks of procedure as well as the alternatives and risks of each were explained to the (patient/caregiver).  Consent for procedure obtained. Time Out: Verified patient identification, verified procedure, site/side was marked, verified correct patient position, special equipment/implants available, medications/allergies/relevent history reviewed, required imaging and test results available.  Performed.  The area was cleaned with iodine and alcohol swabs.    The right lateral epicondyle was injected using 1 cc's of 40 mg Depo-Medrol and 2 cc's of 0.25% bupivacaine with a 25 1 1/2" needle.  Ultrasound was used. Images were obtained in long views showing the injection.     A sterile dressing was applied.  Patient did tolerate procedure well.    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  Occurred while she was at work.  Limited improvement thus far.  MRI was revealing for changes at the origin of the lateral epicondyle -Counseled on home exercise therapy supportive care. - injection.   Superficial foreign body of left hand Foreign object appreciated on ultrasound.  Does not appear to be a trigger finger. -Referral to hand surgery

## 2020-12-23 NOTE — Assessment & Plan Note (Signed)
Foreign object appreciated on ultrasound.  Does not appear to be a trigger finger. -Referral to hand surgery

## 2020-12-23 NOTE — Telephone Encounter (Signed)
Referral has been sent to Rheumatology. Pt has been informed.

## 2020-12-24 NOTE — Telephone Encounter (Signed)
Patient called to follow up on referral states she has not heard from office.

## 2020-12-25 ENCOUNTER — Encounter: Payer: Self-pay | Admitting: Family Medicine

## 2020-12-25 ENCOUNTER — Telehealth: Payer: Self-pay | Admitting: Gastroenterology

## 2020-12-25 ENCOUNTER — Encounter: Payer: Self-pay | Admitting: Gastroenterology

## 2020-12-25 DIAGNOSIS — Z8719 Personal history of other diseases of the digestive system: Secondary | ICD-10-CM | POA: Insufficient documentation

## 2020-12-25 DIAGNOSIS — K649 Unspecified hemorrhoids: Secondary | ICD-10-CM | POA: Insufficient documentation

## 2020-12-25 DIAGNOSIS — K5909 Other constipation: Secondary | ICD-10-CM | POA: Insufficient documentation

## 2020-12-25 DIAGNOSIS — R140 Abdominal distension (gaseous): Secondary | ICD-10-CM | POA: Insufficient documentation

## 2020-12-25 DIAGNOSIS — Z1211 Encounter for screening for malignant neoplasm of colon: Secondary | ICD-10-CM | POA: Insufficient documentation

## 2020-12-25 DIAGNOSIS — R1084 Generalized abdominal pain: Secondary | ICD-10-CM | POA: Insufficient documentation

## 2020-12-25 NOTE — Telephone Encounter (Signed)
Pt has been informed again that referral has been sent and as of today referral has been approved by Dr. Corliss Skains for pt to be seen. Pt made aware that someone from their office will contact her with an appointment.

## 2020-12-25 NOTE — Telephone Encounter (Signed)
The pt has been given the information sent to her by Dr Meridee Score.  All questions answered.  She will call back if she has further concerns.

## 2020-12-27 DIAGNOSIS — M79642 Pain in left hand: Secondary | ICD-10-CM | POA: Diagnosis not present

## 2021-01-20 ENCOUNTER — Encounter: Payer: Self-pay | Admitting: Family Medicine

## 2021-01-20 ENCOUNTER — Ambulatory Visit (INDEPENDENT_AMBULATORY_CARE_PROVIDER_SITE_OTHER): Admitting: Family Medicine

## 2021-01-20 ENCOUNTER — Other Ambulatory Visit: Payer: Self-pay

## 2021-01-20 VITALS — BP 100/70 | Ht 69.0 in | Wt 145.0 lb

## 2021-01-20 DIAGNOSIS — M7711 Lateral epicondylitis, right elbow: Secondary | ICD-10-CM | POA: Diagnosis not present

## 2021-01-20 DIAGNOSIS — G5621 Lesion of ulnar nerve, right upper limb: Secondary | ICD-10-CM

## 2021-01-20 NOTE — Assessment & Plan Note (Signed)
Symptoms seem more suggestive of ulnar component  - referral to neuro for EMG

## 2021-01-20 NOTE — Assessment & Plan Note (Signed)
Having ongoing pain after her injury at work. Seems to have pain radiating distally which could be ulnar nerve involvement.  - counseled on home exercise therapy and supportive care - provided work note  - EMG

## 2021-01-20 NOTE — Patient Instructions (Signed)
Good to see you Please use ice or heat. Whichever one feels better  We will get the neurologist input about the nerve   Please send me a message in MyChart with any questions or updates.  We will do a virtual visit once the nerve study is resulted.   --Dr. Jordan Likes

## 2021-01-20 NOTE — Progress Notes (Signed)
  Meagan Cooper - 45 y.o. female MRN [id number]  Date of birth: October 12, 1976  SUBJECTIVE:  Including CC & ROS.  No chief complaint on file.   Meagan Cooper is a 45 y.o. female that is presenting with acute on chronic right elbow pain. We have tried injection and physical therapy. She continues to have pain from her injury she sustained at work. Has symptoms radiating down to his fingers of her right hand.    Review of Systems See HPI   HISTORY: Past Medical, Surgical, Social, and Family History Reviewed & Updated per EMR.   Pertinent Historical Findings include:  Past Medical History:  Diagnosis Date  . Anemia   . Crohn's disease [facility])     Past Surgical History:  Procedure Laterality Date  . BREAST ENHANCEMENT SURGERY Bilateral   . CESAREAN SECTION     x 2  . TUMMY TUCK      Family History  Problem Relation Age of Onset  . Irritable bowel syndrome Mother   . Neurofibromatosis Son   . Other Daughter        GI problem  . Crohn's disease Maternal Grandmother   . Colon polyps Neg Hx   . Esophageal cancer Neg Hx   . Liver disease Neg Hx   . Pancreatic cancer Neg Hx   . Rectal cancer Neg Hx   . Stomach cancer Neg Hx     Social History   Socioeconomic History  . Marital status: Single    Spouse name: Not on file  . Number of children: 8  . Years of education: Not on file  . Highest education level: Not on file  Occupational History  . Occupation: Paramedic  Tobacco Use  . Smoking status: Never Smoker  . Smokeless tobacco: Never Used  Vaping Use  . Vaping Use: Never used  Substance and Sexual Activity  . Alcohol use: Yes    Comment: occasional  . Drug use: Not Currently  . Sexual activity: Not on file  Other Topics Concern  . Not on file  Social History Narrative  . Not on file   Social Determinants of Health   Financial Resource Strain: Not on file  Food Insecurity: Not on file  Transportation Needs: Not on file  Physical Activity: Not on file   Stress: Not on file  Social Connections: Not on file  Intimate Partner Violence: Not on file     PHYSICAL EXAM:  VS: BP 100/70 (BP Location: Left Arm, Patient Position: Sitting, Cuff Size: Normal)   Ht 5\' 9"  (1.753 m)   Wt 145 lb (65.8 kg)   BMI 21.41 kg/m  Physical Exam Gen: NAD, alert, cooperative with exam, well-appearing     ASSESSMENT & PLAN:   Lateral epicondylitis Having ongoing pain after her injury at work. Seems to have pain radiating distally which could be ulnar nerve involvement.  - counseled on home exercise therapy and supportive care - provided work note  - EMG   Ulnar neuropathy of right upper extremity Symptoms seem more suggestive of ulnar component  - referral to neuro for EMG

## 2021-01-21 NOTE — Addendum Note (Signed)
Addended by: Merrilyn Puma on: 01/21/2021 08:37 AM   Modules accepted: Orders

## 2021-01-29 ENCOUNTER — Other Ambulatory Visit: Payer: Self-pay

## 2021-01-29 ENCOUNTER — Ambulatory Visit (INDEPENDENT_AMBULATORY_CARE_PROVIDER_SITE_OTHER): Payer: Federal, State, Local not specified - PPO | Admitting: Internal Medicine

## 2021-01-29 ENCOUNTER — Ambulatory Visit: Payer: Self-pay

## 2021-01-29 ENCOUNTER — Encounter: Payer: Self-pay | Admitting: Internal Medicine

## 2021-01-29 VITALS — BP 111/75 | HR 69 | Ht 68.0 in | Wt 147.0 lb

## 2021-01-29 DIAGNOSIS — Z8719 Personal history of other diseases of the digestive system: Secondary | ICD-10-CM | POA: Diagnosis not present

## 2021-01-29 DIAGNOSIS — M5388 Other specified dorsopathies, sacral and sacrococcygeal region: Secondary | ICD-10-CM | POA: Diagnosis not present

## 2021-01-29 DIAGNOSIS — M2550 Pain in unspecified joint: Secondary | ICD-10-CM | POA: Diagnosis not present

## 2021-01-29 DIAGNOSIS — G8929 Other chronic pain: Secondary | ICD-10-CM | POA: Diagnosis not present

## 2021-01-29 DIAGNOSIS — M25551 Pain in right hip: Secondary | ICD-10-CM | POA: Diagnosis not present

## 2021-01-29 DIAGNOSIS — M25552 Pain in left hip: Secondary | ICD-10-CM

## 2021-01-29 DIAGNOSIS — E559 Vitamin D deficiency, unspecified: Secondary | ICD-10-CM | POA: Insufficient documentation

## 2021-01-29 DIAGNOSIS — M533 Sacrococcygeal disorders, not elsewhere classified: Secondary | ICD-10-CM

## 2021-01-29 DIAGNOSIS — M25559 Pain in unspecified hip: Secondary | ICD-10-CM

## 2021-01-29 DIAGNOSIS — R700 Elevated erythrocyte sedimentation rate: Secondary | ICD-10-CM | POA: Insufficient documentation

## 2021-01-29 DIAGNOSIS — M2559 Pain in other specified joint: Secondary | ICD-10-CM

## 2021-01-29 NOTE — Patient Instructions (Addendum)
Erythrocyte Sedimentation Rate Test Why am I having this test? The erythrocyte sedimentation rate (ESR) test is used to help find illnesses related to:  Sudden (acute) or long-term (chronic) infections.  Inflammation.  The body's disease-fighting system attacking healthy cells (autoimmune diseases).  Cancer.  Tissue death. If you have symptoms that may be related to any of these illnesses, your health care provider may do an ESR test before doing more specific tests. If you have an inflammatory immune disease, such as rheumatoid arthritis, you may have this test to help monitor your therapy. What is being tested? This test measures how long it takes for your red blood cells (erythrocytes) to settle in a solution over a certain amount of time (sedimentation rate). When you have an infection or inflammation, your red blood cells clump together and settle faster. The sedimentation rate provides information about how much inflammation is present in the body. What kind of sample is taken? A blood sample is required for this test. It is usually collected by inserting a needle into a blood vessel.   How do I prepare for this test? Follow any instructions from your health care provider about changing or stopping your regular medicines. Tell a health care provider about:  Any allergies you have.  All medicines you are taking, including vitamins, herbs, eye drops, creams, and over-the-counter medicines.  Any blood disorders you have.  Any surgeries you have had.  Any medical conditions you have, such as thyroid or kidney disease.  Whether you are pregnant or may be pregnant. How are the results reported? Your results will be reported as a value that measures sedimentation rate in millimeters per hour (mm/hr). Your health care provider will compare your results to normal ranges that were established after testing a large group of people (reference values). Reference values may vary among  labs and hospitals. For this test, common reference values, which vary by age and gender, are:  Newborn: 0-2 mm/hr.  Child, up to puberty: 0-10 mm/hr.  Female: ? Under 50 years: 0-20 mm/hr. ? 50-85 years: 0-30 mm/hr. ? Over 85 years: 0-42 mm/hr.  Female: ? Under 50 years: 0-15 mm/hr. ? 50-85 years: 0-20 mm/hr. ? Over 85 years: 0-30 mm/hr. Certain conditions or medicines may cause ESR levels to be falsely lower or higher, such as:  Pregnancy.  Obesity.  Steroids, birth control pills, and blood thinners.  Thyroid or kidney disease. What do the results mean? Results that are within reference values are considered normal, meaning that the level of inflammation in your body is healthy. High ESR levels mean that there is inflammation in your body. You will have more tests to help make a diagnosis. Inflammation may result from many different conditions or injuries. Talk with your health care provider about what your results mean. Questions to ask your health care provider Ask your health care provider, or the department that is doing the test:  When will my results be ready?  How will I get my results?  What are my treatment options?  What other tests do I need?  What are my next steps? Summary  The erythrocyte sedimentation rate (ESR) test is used to help find illnesses associated with sudden (acute) or long-term (chronic) infections, inflammation, autoimmune diseases, cancer, or tissue death.  If you have symptoms that may be related to any of these illnesses, your health care provider may do an ESR test before doing more specific tests. If you have an inflammatory immune disease, such as rheumatoid  arthritis, you may have this test to help monitor your therapy.  This test measures how long it takes for your red blood cells (erythrocytes) to settle in a solution over a certain amount of time (sedimentation rate). This provides information about how much inflammation is present  in the body.    Rheumatoid Factor Test Why am I having this test? The rheumatoid factor test is used to help diagnose certain autoimmune diseases. Normally, your body makes protective proteins called antibodies (IgM, IgG, and IgA) to help fight off infections. If you have an autoimmune disease, your body may make a collection of antibodies that do not function correctly (autoantibodies). They attack tissues that are wrongly identified as foreign. In some autoimmune diseases, these autoantibodies are known as the rheumatoid factor (RF). You may have this test if your health care provider suspects that you have an autoimmune disease, such as:  Rheumatoid arthritis (RA).  Systemic lupus erythematosus (SLE).  Sjgren's syndrome.  Mixed connective tissue disease. A majority of people who have rheumatoid arthritis have a positive rheumatoid factor. Raised levels of these autoantibodies can also sometimes be a sign of other autoimmune diseases. However, it is also possible for the RF test to be negative even when a disease is present. Likewise, a small number of people may have a positive RF test when an autoimmune disease is not actually present. Other tests may be needed to help make a diagnosis. What is being tested? This test checks your blood for the RF autoantibodies. What kind of sample is taken? A blood sample is required for this test. It is usually collected by inserting a needle into a blood vessel or by sticking a finger with a small needle.   How are the results reported? Your test result will be reported as either positive or negative for RF autoantibodies. What do the results mean? A negative result means that no RF or only a small amount was found in your blood. This means that it is unlikely that you have an autoimmune disease. A positive result means that a larger amount of RF autoantibodies was found in your blood. This may indicate that you have RA or another autoimmune disease.  Your health care provider will talk to you about doing more tests to confirm your results. Talk with your health care provider about what your results mean. Questions to ask your health care provider Ask your health care provider, or the department that is doing the test:  When will my results be ready?  How will I get my results?  What are my treatment options?  What other tests do I need?  What are my next steps? Summary  The rheumatoid factor test is used to help diagnose certain autoimmune diseases.  In some autoimmune diseases, the body makes autoantibodies known as the rheumatoid factor (RF), which attack tissues that are wrongly identified as foreign.  A negative result means that no RF or only a small amount was found in your blood.  A positive result means that a larger amount of RF autoantibodies was found in your blood.  Talk with your health care provider about what your results mean.    Vitamin D Test Why am I having this test? Vitamin D is a vitamin that your body makes when you are exposed to sunlight. It is also found naturally in fish and eggs, and it is added to some foods, such as cereals and dairy products. You need vitamin D to maintain bone health and  to support your disease-fighting (immune) system. You may have a vitamin D test:  To help diagnose osteoporosis or other bone disorders.  To monitor treatment of osteoporosis or other bone disorders.  If you have symptoms of a lack (deficiency) of vitamin D, such as experiencing broken bones from minor injuries.  If you lack certain nutrients in your diet (dietary deficiencies).  If you have problems absorbing nutrients during digestion. What is being tested? There are two types of vitamin D tests that measure slightly different things:  The 25-hydroxyvitamin D test measures how much of a substance called 25-hydroxyvitamin D is in your blood. The body converts vitamin D to 25-hydroxyvitamin D in the  liver. Measuring how much 25-hydroxyvitamin D is in your blood provides a good idea of your vitamin D levels. This is the most common type of vitamin D test.  The 1,25-dihydroxyvitamin D test measures how much of a substance called 1,25-dihydroxyvitamin D is in your blood. The body converts vitamin D into this substance and then uses it for various functions. This test provides an idea of your total vitamin D levels. What kind of sample is taken? A blood sample is required for this test. It is usually collected by inserting a needle into a blood vessel or by sticking a finger with a small needle.   Tell a health care provider about:  Any allergies you have.  All medicines you are taking, including vitamins, herbs, eye drops, creams, and over-the-counter medicines.  Any blood disorders you have.  Any surgeries you have had.  Any medical conditions you have.  Whether you are pregnant or may be pregnant. How are the results reported? Your test results will be reported as a value that tells you how much vitamin D is in your blood. Your health care provider will compare your results to normal ranges that were established after testing a large group of people (reference ranges). Reference ranges may vary among labs and hospitals. For vitamin D tests, common reference ranges are:  25-hydroxyvitamin D: 25-80 ng/mL.  1,25-dihydroxyvitamin D: ? Males: 18-64 pg/mL. ? Females: 18-78 pg/mL. What do the results mean? If your result is within your reference range, this means that you have a normal amount of vitamin D in your blood. If your result is lower than your reference range, this means that you have too little vitamin D in your body.  If you had the 25-hydroxyvitamin D test specifically: ? A result of 21-29 ng/mL means that you have slightly lower levels of vitamin D than normal (vitamin D insufficiency). ? A result of 20 ng/mL or less means that you have very low levels of vitamin D (vitamin  D deficiency).  Low levels of vitamin D can indicate: ? Not having enough exposure to sunlight. ? Not eating enough foods that contain vitamin D. ? Osteoporosis. ? Kidney disease. ? Liver disease. ? A bone disease that makes the bones weak, soft, or poorly developed (rickets). ? Softening of the bones (osteomalacia). ? The body not absorbing vitamin D normally (gastrointestinal malabsorption). If your result is higher than your reference range, this means that you have too much vitamin D in your body. This can result from:  Taking too many dietary supplements.  Hyperparathyroidism. This is a rare condition in which you do not make enough of a certain type of hormone (parathyroid hormone or PTH).  A disease that causes inflammation in your organs and other areas of your body (sarcoidosis).  A rare developmental disorder that  is present at birth Jimmye Norman syndrome). Talk with your health care provider about what your results mean. Questions to ask your health care provider Ask your health care provider, or the department that is doing the test:  When will my results be ready?  How will I get my results?  What are my treatment options?  What other tests do I need?  What are my next steps? Summary  You may have a vitamin D test to determine if your body has enough vitamin D. You need vitamin D to maintain bone health and to support your disease-fighting (immune) system.  Your vitamin D level is determined with a blood test.  Talk with your health care provider about what your results mean. This information is not intended to replace advice given to you by your health care provider. Make sure you discuss any questions you have with your health care provider. Document Revised: 07/11/2020 Document Reviewed: 07/11/2020 Elsevier Patient Education  [place].

## 2021-01-29 NOTE — Progress Notes (Signed)
Office Visit Note  Patient: Meagan Cooper             Date of Birth: 07-02-1976           MRN: [id number]             PCP: Marco Collie, MD Referring: Irving Copas.* Visit Date: 01/29/2021   Subjective:  New Patient (Initial Visit) (Patient complains of generalized joint aches and pains, patient also complains of decreased energy level. )   History of Present Illness: Meagan Cooper is a 45 y.o. female here for evaluation of arthritis specifically concern for association with possible Crohn's disease history.  She was previously having symptoms of abdominal pain, constipation, hemorrhoidal bleeding seen by her doctors in [place] but moved and is seeing Dr. Donzetta Matters for the past 5 years.  She does not report having a previous colonoscopy to evaluate for this yet but is seeing Dr. Rush Landmark here now. Her joint problems include a lateral epicondyle problems with a previous tendon tear seen on MRI last year also ulnar neuropathy symptoms.  She is seeing sports medicine for this particular issue had steroid injection for this recently that did cause a short flareup of symptoms last month.  However she also describes increasing generalized joint pains of numerous sites besides the elbow and associated with an overall fatigue.  She has tried several treatments for this including topical Voltaren and Aspercreme and using naproxen are not especially helpful.  She is trying to increase her physical activity again with more yoga and gym exercise.  Labs reviewed 12/2020 CBC wnl BMP wnl TSH wnl ESR 23 hsCRP wnl  Activities of Daily Living:  Patient reports morning stiffness for 5-10 minutes.   Patient Reports nocturnal pain.  Difficulty dressing/grooming: Denies Difficulty climbing stairs: Reports Difficulty getting out of chair: Denies Difficulty using hands for taps, buttons, cutlery, and/or writing: Reports  Review of Systems  Constitutional: Positive for fatigue.  HENT: Negative  for mouth sores, mouth dryness and nose dryness.   Eyes: Positive for visual disturbance. Negative for pain, itching and dryness.  Respiratory: Positive for cough. Negative for hemoptysis, shortness of breath and difficulty breathing.   Cardiovascular: Negative for chest pain, palpitations and swelling in legs/feet.  Gastrointestinal: Positive for abdominal pain and constipation. Negative for blood in stool and diarrhea.  Endocrine: Negative for increased urination.  Genitourinary: Negative for painful urination.  Musculoskeletal: Positive for arthralgias, joint pain, joint swelling, myalgias, muscle weakness, morning stiffness, muscle tenderness and myalgias.  Skin: Negative for color change, rash and redness.  Allergic/Immunologic: Negative for susceptible to infections.  Neurological: Positive for numbness and weakness. Negative for dizziness, headaches and memory loss.  Hematological: Negative for swollen glands.  Psychiatric/Behavioral: Positive for sleep disturbance. Negative for confusion.    PMFS History:  Patient Active Problem List   Diagnosis Date Noted  . Chronic coccygeal pain 01/29/2021  . Lateral pain of hip 01/29/2021  . Polyarthralgia 01/29/2021  . Sedimentation rate elevation 01/29/2021  . Vitamin D deficiency 01/29/2021  . Ulnar neuropathy of right upper extremity 01/20/2021  . Hemorrhoids 12/25/2020  . Bloating symptom 12/25/2020  . Generalized abdominal pain 12/25/2020  . Colon cancer screening 12/25/2020  . Chronic constipation 12/25/2020  . Unclear history of Crohn's disease 12/25/2020  . Superficial foreign body of left hand 12/23/2020  . Lateral epicondylitis 02/15/2020    Past Medical History:  Diagnosis Date  . Anemia   . Crohn's disease ([place])     Family History  Problem Relation Age of Onset  . Irritable bowel syndrome Mother   . Neurofibromatosis Son   . Other Daughter        GI problem  . Crohn's disease Maternal Grandmother   . Lung disease  Father   . Colon polyps Neg Hx   . Esophageal cancer Neg Hx   . Liver disease Neg Hx   . Pancreatic cancer Neg Hx   . Rectal cancer Neg Hx   . Stomach cancer Neg Hx    Past Surgical History:  Procedure Laterality Date  . BREAST ENHANCEMENT SURGERY Bilateral   . CESAREAN SECTION     x 2  . TUMMY TUCK     Social History   Social History Narrative  . Not on file    There is no immunization history on file for this patient.   Objective: Vital Signs: BP 111/75 (BP Location: Right Arm, Patient Position: Sitting, Cuff Size: Normal)   Pulse 69   Ht _0  (1.727 m)   Wt 147 lb (66.7 kg)   BMI 22.35 kg/m    Physical Exam HENT:     Right Ear: External ear normal.     Left Ear: External ear normal.     Mouth/Throat:     Mouth: Mucous membranes are moist.     Pharynx: Oropharynx is clear.  Eyes:     Conjunctiva/sclera: Conjunctivae normal.  Cardiovascular:     Rate and Rhythm: Normal rate and regular rhythm.  Pulmonary:     Effort: Pulmonary effort is normal.     Breath sounds: Normal breath sounds.  Skin:    General: Skin is warm and dry.     Findings: No rash.  Neurological:     General: No focal deficit present.     Mental Status: She is alert.     Deep Tendon Reflexes: Reflexes normal.  Psychiatric:        Mood and Affect: Mood normal.     Musculoskeletal Exam:  Neck full ROM no tenderness Shoulders full ROM, mild tenderness to palpation and over bicipital groove Elbows left ROM exceeds 180 degrees, tenderness at bicipital tendon insertion, right elbow in brace tender to pressure and movement Wrists full ROM no swelling, tender to pressure over dorsum of hand Fingers full ROM no tenderness or swelling Hip normal internal and external rotation without pain, no tenderness to lateral hip palpation Knees full ROM no tenderness or swelling Ankles full ROM no tenderness or swelling MTPs full ROM no tenderness or swelling   Investigation: No additional  findings.  Imaging: XR Pelvis 1-2 Views  Result Date: 01/29/2021 X-ray pelvis 2 views SI joints appear patent bilaterally some probable sclerosis on the iliac side but no visible erosions or joint effusion.  Visualized lumbar vertebral bodies appear normal.  Mild surface irregularities of right femoral acetabular joint without significant joint space narrowing. Impression No significant sacroiliitis or hip arthritis changes are visible   Recent Labs: Lab Results  Component Value Date   WBC 5.8 12/18/2020   HGB 11.9 (L) 12/18/2020   PLT 263.0 12/18/2020   NA 137 12/18/2020   K 3.8 12/18/2020   CL 104 12/18/2020   CO2 30 12/18/2020   GLUCOSE 80 12/18/2020   BUN 9 12/18/2020   CREATININE 0.72 12/18/2020   CALCIUM 9.2 12/18/2020    Speciality Comments: No specialty comments available.  Procedures:  No procedures performed Allergies: Patient has no known allergies.   Assessment / Plan:     Visit  Diagnoses: Polyarthralgia - Plan: Rheumatoid factor, Sedimentation rate  She has joint pain at multiple sites no particular inflammatory changes are seen on exam.  Focal tenderness to palpation is more over tendon insertions versus pain with joint range of motion.  We will check rheumatoid factor as well as repeat sedimentation rate for any change compared to prior labs.  Initial impression is less likely for an active inflammatory disease but consider conservative treatment such as physical therapy unless new findings indicate otherwise.  Unclear history of Crohn's disease  She history of chronic GI symptoms no clearly diagnosed IBD process but is following up with gastroenterology for this.  Enthesitis can be a manifestation of IBD associated arthropathy but seems less likely with the currently described symptoms.  Chronic coccygeal pain Lateral pain of hip - Plan: XR Pelvis 1-2 Views  Chronic pain around the coccyx and some lateral area hip pains we will check x-ray of the pelvis.   Physical exam is negative for SI pain provocative maneuvers but will screen for involvement at this time.  Vitamin D deficiency - Plan: VITAMIN D 25 Hydroxy (Vit-D Deficiency, Fractures)  Describes low energy and generalized pain from history reviewed questionable for the history of vitamin D deficiency not currently on supplement.  We will check by today if low would recommend going on supplement.  Orders: Orders Placed This Encounter  Procedures  . XR Pelvis 1-2 Views  . Rheumatoid factor  . Sedimentation rate  . VITAMIN D 25 Hydroxy (Vit-D Deficiency, Fractures)   No orders of the defined types were placed in this encounter.   Follow-Up Instructions: Return in about 2 weeks (around 02/12/2021) for New pt f/u arthralgias, mildly positive ESR, possible IBD.   Collier Salina, MD  Note - This record has been created using [employer].  Chart creation errors have been sought, but may not always  have been located. Such creation errors do not reflect on  the standard of medical care.

## 2021-01-30 ENCOUNTER — Encounter: Payer: Self-pay | Admitting: Gastroenterology

## 2021-01-30 LAB — RHEUMATOID FACTOR: Rheumatoid fact SerPl-aCnc: 17 IU/mL — ABNORMAL HIGH (ref <14)

## 2021-01-30 LAB — VITAMIN D 25 HYDROXY (VIT D DEFICIENCY, FRACTURES): Vit D, 25-Hydroxy: 32 ng/mL (ref 30–100)

## 2021-01-30 LAB — SEDIMENTATION RATE: Sed Rate: 6 mm/h (ref 0–20)

## 2021-01-31 DIAGNOSIS — R2232 Localized swelling, mass and lump, left upper limb: Secondary | ICD-10-CM | POA: Diagnosis not present

## 2021-01-31 DIAGNOSIS — M7711 Lateral epicondylitis, right elbow: Secondary | ICD-10-CM | POA: Diagnosis not present

## 2021-02-12 ENCOUNTER — Ambulatory Visit: Payer: Federal, State, Local not specified - PPO | Admitting: Internal Medicine

## 2021-02-12 ENCOUNTER — Ambulatory Visit: Payer: Federal, State, Local not specified - PPO | Admitting: Diagnostic Neuroimaging

## 2021-02-18 ENCOUNTER — Encounter: Payer: Self-pay | Admitting: Family Medicine

## 2021-02-20 ENCOUNTER — Ambulatory Visit: Payer: Federal, State, Local not specified - PPO | Admitting: Family Medicine

## 2021-03-06 ENCOUNTER — Ambulatory Visit (INDEPENDENT_AMBULATORY_CARE_PROVIDER_SITE_OTHER): Admitting: Family Medicine

## 2021-03-06 ENCOUNTER — Ambulatory Visit: Payer: Federal, State, Local not specified - PPO | Admitting: Neurology

## 2021-03-06 ENCOUNTER — Other Ambulatory Visit: Payer: Self-pay

## 2021-03-06 ENCOUNTER — Encounter: Payer: Self-pay | Admitting: Family Medicine

## 2021-03-06 DIAGNOSIS — M7711 Lateral epicondylitis, right elbow: Secondary | ICD-10-CM

## 2021-03-06 DIAGNOSIS — G5621 Lesion of ulnar nerve, right upper limb: Secondary | ICD-10-CM | POA: Diagnosis not present

## 2021-03-06 NOTE — Assessment & Plan Note (Signed)
Pain originated after an injury at work.  Initially had trauma to the area which could exacerbate the underlying changes are contributing to changes occurring at the lateral epicondyle.

## 2021-03-06 NOTE — Patient Instructions (Signed)
Good to see you  Please send me a message in MyChart with any questions or updates.  We will follow up after the EMG.   --Dr. Jordan Likes

## 2021-03-06 NOTE — Progress Notes (Signed)
  Meagan Cooper - 45 y.o. female MRN [id number]  Date of birth: 10/08/1976  SUBJECTIVE:  Including CC & ROS.  No chief complaint on file.   Meagan Cooper is a 45 y.o. female that is following up for her injury at work.    Review of Systems See HPI   HISTORY: Past Medical, Surgical, Social, and Family History Reviewed & Updated per EMR.   Pertinent Historical Findings include:  Past Medical History:  Diagnosis Date  . Anemia   . Crohn's disease [facility])     Past Surgical History:  Procedure Laterality Date  . BREAST ENHANCEMENT SURGERY Bilateral   . CESAREAN SECTION     x 2  . TUMMY TUCK      Family History  Problem Relation Age of Onset  . Irritable bowel syndrome Mother   . Neurofibromatosis Son   . Other Daughter        GI problem  . Crohn's disease Maternal Grandmother   . Lung disease Father   . Colon polyps Neg Hx   . Esophageal cancer Neg Hx   . Liver disease Neg Hx   . Pancreatic cancer Neg Hx   . Rectal cancer Neg Hx   . Stomach cancer Neg Hx     Social History   Socioeconomic History  . Marital status: Single    Spouse name: Not on file  . Number of children: 8  . Years of education: Not on file  . Highest education level: Not on file  Occupational History  . Occupation: Paramedic  Tobacco Use  . Smoking status: Never Smoker  . Smokeless tobacco: Never Used  Vaping Use  . Vaping Use: Never used  Substance and Sexual Activity  . Alcohol use: Yes    Comment: occasional  . Drug use: Not Currently  . Sexual activity: Not on file  Other Topics Concern  . Not on file  Social History Narrative  . Not on file   Social Determinants of Health   Financial Resource Strain: Not on file  Food Insecurity: Not on file  Transportation Needs: Not on file  Physical Activity: Not on file  Stress: Not on file  Social Connections: Not on file  Intimate Partner Violence: Not on file     PHYSICAL EXAM:  VS: BP 140/90 (BP Location: Left Arm, Patient  Position: Sitting, Cuff Size: Normal)   Ht 5\' 8"  (1.727 m)   Wt 142 lb (64.4 kg)   BMI 21.59 kg/m  Physical Exam Gen: NAD, alert, cooperative with exam, well-appearing     ASSESSMENT & PLAN:   Lateral epicondylitis Pain originated after an injury at work.  Initially had trauma to the area which could exacerbate the underlying changes are contributing to changes occurring at the lateral epicondyle.  Ulnar neuropathy of right upper extremity Occurring after an injury sustained at work.  Initially had trauma to the area which could have caused contusion to the nerve or irritation of the nerve which is resulted in her symptoms. -EMG has been ordered and awaiting results.

## 2021-03-06 NOTE — Assessment & Plan Note (Signed)
Occurring after an injury sustained at work.  Initially had trauma to the area which could have caused contusion to the nerve or irritation of the nerve which is resulted in her symptoms. -EMG has been ordered and awaiting results.

## 2021-03-11 ENCOUNTER — Ambulatory Visit: Payer: Federal, State, Local not specified - PPO | Admitting: Family Medicine

## 2021-03-24 ENCOUNTER — Ambulatory Visit (INDEPENDENT_AMBULATORY_CARE_PROVIDER_SITE_OTHER): Admitting: Family Medicine

## 2021-03-24 ENCOUNTER — Other Ambulatory Visit: Payer: Self-pay

## 2021-03-24 ENCOUNTER — Encounter: Payer: Self-pay | Admitting: Family Medicine

## 2021-03-24 DIAGNOSIS — M7711 Lateral epicondylitis, right elbow: Secondary | ICD-10-CM | POA: Diagnosis not present

## 2021-03-24 NOTE — Progress Notes (Signed)
  Meagan Cooper - 45 y.o. female MRN [id number]  Date of birth: 05-11-1976  SUBJECTIVE:  Including CC & ROS.  No chief complaint on file.   Meagan Cooper is a 45 y.o. female that is following up for her right elbow pain after an injury at work.    Review of Systems See HPI   HISTORY: Past Medical, Surgical, Social, and Family History Reviewed & Updated per EMR.   Pertinent Historical Findings include:  Past Medical History:  Diagnosis Date   Anemia    Crohn's disease (HCC)     Past Surgical History:  Procedure Laterality Date   BREAST ENHANCEMENT SURGERY Bilateral    CESAREAN SECTION     x 2   TUMMY TUCK      Family History  Problem Relation Age of Onset   Irritable bowel syndrome Mother    Neurofibromatosis Son    Other Daughter        GI problem   Crohn's disease Maternal Grandmother    Lung disease Father    Colon polyps Neg Hx    Esophageal cancer Neg Hx    Liver disease Neg Hx    Pancreatic cancer Neg Hx    Rectal cancer Neg Hx    Stomach cancer Neg Hx     Social History   Socioeconomic History   Marital status: Single    Spouse name: Not on file   Number of children: 8   Years of education: Not on file   Highest education level: Not on file  Occupational History   Occupation: Paramedic  Tobacco Use   Smoking status: Never   Smokeless tobacco: Never  Vaping Use   Vaping Use: Never used  Substance and Sexual Activity   Alcohol use: Yes    Comment: occasional   Drug use: Not Currently   Sexual activity: Not on file  Other Topics Concern   Not on file  Social History Narrative   Not on file   Social Determinants of Health   Financial Resource Strain: Not on file  Food Insecurity: Not on file  Transportation Needs: Not on file  Physical Activity: Not on file  Stress: Not on file  Social Connections: Not on file  Intimate Partner Violence: Not on file     PHYSICAL EXAM:  VS: BP 120/70 (BP Location: Left Arm, Patient Position:  Sitting, Cuff Size: Normal)   Ht 5\' 8"  (1.727 m)   BMI 21.59 kg/m  Physical Exam Gen: NAD, alert, cooperative with exam, well-appearing     ASSESSMENT & PLAN:   Lateral epicondylitis Pain is ongoing after her injury at work. -Completed paperwork  -Could consider shockwave therapy or PRP

## 2021-03-24 NOTE — Assessment & Plan Note (Signed)
Pain is ongoing after her injury at work. -Completed paperwork  -Could consider shockwave therapy or PRP

## 2021-03-24 NOTE — Patient Instructions (Signed)
Good to see you Please let us know about shockwave therapy  We will be in contact when the nerve study is resulted Please send me a message in MyChart with any questions or updates.  Please see me back in 4 weeks.   --Dr. Jordan Likes

## 2021-04-01 ENCOUNTER — Encounter: Payer: Self-pay | Admitting: Family Medicine

## 2021-04-09 ENCOUNTER — Other Ambulatory Visit: Payer: Self-pay

## 2021-04-09 ENCOUNTER — Ambulatory Visit (INDEPENDENT_AMBULATORY_CARE_PROVIDER_SITE_OTHER): Admitting: Family Medicine

## 2021-04-09 ENCOUNTER — Encounter: Payer: Self-pay | Admitting: Family Medicine

## 2021-04-09 DIAGNOSIS — M7711 Lateral epicondylitis, right elbow: Secondary | ICD-10-CM

## 2021-04-09 NOTE — Assessment & Plan Note (Signed)
Pain occurred after an injury at work. -Paperwork completed today. -Can pursue shockwave therapy.

## 2021-04-09 NOTE — Progress Notes (Signed)
  Meagan Cooper - 45 y.o. female MRN [id number]  Date of birth: 01-30-1976  SUBJECTIVE:  Including CC & ROS.  No chief complaint on file.   Meagan Cooper is a 45 y.o. female that is following up for her right elbow pain after injury sustained at work.  She is needing paperwork completed at this visit.   Review of Systems See HPI   HISTORY: Past Medical, Surgical, Social, and Family History Reviewed & Updated per EMR.   Pertinent Historical Findings include:  Past Medical History:  Diagnosis Date   Anemia    Crohn's disease (HCC)     Past Surgical History:  Procedure Laterality Date   BREAST ENHANCEMENT SURGERY Bilateral    CESAREAN SECTION     x 2   TUMMY TUCK      Family History  Problem Relation Age of Onset   Irritable bowel syndrome Mother    Neurofibromatosis Son    Other Daughter        GI problem   Crohn's disease Maternal Grandmother    Lung disease Father    Colon polyps Neg Hx    Esophageal cancer Neg Hx    Liver disease Neg Hx    Pancreatic cancer Neg Hx    Rectal cancer Neg Hx    Stomach cancer Neg Hx     Social History   Socioeconomic History   Marital status: Single    Spouse name: Not on file   Number of children: 8   Years of education: Not on file   Highest education level: Not on file  Occupational History   Occupation: Paramedic  Tobacco Use   Smoking status: Never   Smokeless tobacco: Never  Vaping Use   Vaping Use: Never used  Substance and Sexual Activity   Alcohol use: Yes    Comment: occasional   Drug use: Not Currently   Sexual activity: Not on file  Other Topics Concern   Not on file  Social History Narrative   Not on file   Social Determinants of Health   Financial Resource Strain: Not on file  Food Insecurity: Not on file  Transportation Needs: Not on file  Physical Activity: Not on file  Stress: Not on file  Social Connections: Not on file  Intimate Partner Violence: Not on file     PHYSICAL EXAM:  VS:  BP 122/78 (BP Location: Left Arm, Patient Position: Sitting, Cuff Size: Normal)   Ht 5\' 8"  (1.727 m)   Wt 135 lb (61.2 kg)   BMI 20.53 kg/m  Physical Exam Gen: NAD, alert, cooperative with exam, well-appearing    ASSESSMENT & PLAN:   Lateral epicondylitis Pain occurred after an injury at work. -Paperwork completed today. -Can pursue shockwave therapy.

## 2021-06-23 ENCOUNTER — Encounter: Payer: Self-pay | Admitting: Neurology

## 2021-06-23 ENCOUNTER — Ambulatory Visit (INDEPENDENT_AMBULATORY_CARE_PROVIDER_SITE_OTHER): Payer: Federal, State, Local not specified - PPO | Admitting: Neurology

## 2021-06-23 ENCOUNTER — Other Ambulatory Visit: Payer: Self-pay

## 2021-06-23 VITALS — BP 123/79 | HR 79 | Ht 69.0 in | Wt 143.0 lb

## 2021-06-23 DIAGNOSIS — G5621 Lesion of ulnar nerve, right upper limb: Secondary | ICD-10-CM | POA: Diagnosis not present

## 2021-06-23 MED ORDER — GABAPENTIN 300 MG PO CAPS: 300.0000 mg | ORAL_CAPSULE | Freq: Three times a day (TID) | ORAL | 11 refills | Status: AC

## 2021-06-23 NOTE — Progress Notes (Signed)
Chief Complaint  Patient presents with   New Patient (Initial Visit)    NP internal referral for Ulnar neuropathy of right upper extremity-req NCS/EMG, pt has no pcp,       ASSESSMENT AND PLAN  Meagan Cooper is a 45 y.o. female   Right elbow Tinel's sign, right fifth finger paresthesia  Suggestive of right ulnar neuropathy  EMG nerve conduction study  Gabapentin 300 mg 3 times daily  Also suggested heating pad  DIAGNOSTIC DATA (LABS, IMAGING, TESTING) - I reviewed patient records, labs, notes, testing and imaging myself where available.   MEDICAL HISTORY:  Meagan Cooper is a 45 year old right-handed female, seen in request by primary care physician Dr. Rosemarie Ax, with evaluation of right elbow discomfort, radiating paresthesia to the right fifth finger, initial evaluation was on June 23, 2021    I reviewed and summarized the referring note. PMHX. 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  PHYSICAL EXAMNIATION:  Gen: NAD, conversant, well nourised, well groomed                     Cardiovascular: Regular rate rhythm, no peripheral edema, warm, nontender. Eyes: Conjunctivae clear without exudates or hemorrhage Neck: Supple, no carotid bruits. Pulmonary: Clear to auscultation bilaterally   NEUROLOGICAL EXAM:  MENTAL  STATUS: Speech:    Speech is normal; fluent and spontaneous with normal comprehension.  Cognition:     Orientation to time, place and person     Normal recent and remote memory     Normal Attention span and concentration     Normal Language, naming, repeating,spontaneous speech     Fund of knowledge   CRANIAL NERVES: CN II: Visual fields are full to confrontation. Pupils are round equal and briskly reactive to light. CN III, IV, VI: extraocular movement are normal. No ptosis. CN V: Facial sensation is intact to light touch CN VII: Face is symmetric with normal eye closure  CN VIII: Hearing is normal to causal conversation. CN IX, X: Phonation is normal. CN XI: Head turning and shoulder shrug are intact  MOTOR: Right elbow sensitivity, Tinel signs, no significant weakness,  REFLEXES: Reflexes are 2+ and symmetric at the biceps, triceps, knees, and ankles. Plantar responses are flexor.  SENSORY: Intact to light touch, pinprick and vibratory sensation are intact in fingers and toes.  COORDINATION: There is no trunk or limb dysmetria noted.  GAIT/STANCE: Posture is normal. Gait is steady with normal steps, base, arm swing, and turning. Heel and toe walking are normal. Tandem gait is normal.  Romberg is absent.  REVIEW OF SYSTEMS:  Full 14 system review of systems performed and notable only for as above All other review of systems were negative.   ALLERGIES: No Known Allergies  HOME MEDICATIONS: Current Outpatient Medications  Medication Sig Dispense  Refill   diclofenac Sodium (VOLTAREN) 1 % GEL Apply 2 g topically 4 (four) times daily. To affected joint. 100 g 11   Multiple Vitamin (MULTIVITAMIN) tablet Take 1 tablet by mouth daily.     norethindrone (MICRONOR) 0.35 MG tablet Take 1 tablet by mouth daily.     PEG-KCl-NaCl-NaSulf-Na Asc-C (PLENVU) 140 g SOLR Take 1 kit by mouth as directed. Use coupon: BIN: [id number] PNC: CNRX Group: [id number] ID: [id number] 1 each 0   No  current facility-administered medications for this visit.    PAST MEDICAL HISTORY: Past Medical History:  Diagnosis Date   Anemia    Crohn's disease ([place])     PAST SURGICAL HISTORY: Past Surgical History:  Procedure Laterality Date   BREAST ENHANCEMENT SURGERY Bilateral    CESAREAN SECTION     x 2   TUMMY TUCK      FAMILY HISTORY: Family History  Problem Relation Age of Onset   Irritable bowel syndrome Mother    Neurofibromatosis Son    Other Daughter        GI problem   Crohn's disease Maternal Grandmother    Lung disease Father    Colon polyps Neg Hx    Esophageal cancer Neg Hx    Liver disease Neg Hx    Pancreatic cancer Neg Hx    Rectal cancer Neg Hx    Stomach cancer Neg Hx     SOCIAL HISTORY: Social History   Socioeconomic History   Marital status: Single    Spouse name: Not on file   Number of children: 8   Years of education: Not on file   Highest education level: Not on file  Occupational History   Occupation: Tour manager  Tobacco Use   Smoking status: Never   Smokeless tobacco: Never  Vaping Use   Vaping Use: Never used  Substance and Sexual Activity   Alcohol use: Yes    Comment: occasional   Drug use: Not Currently   Sexual activity: Not on file  Other Topics Concern   Not on file  Social History Narrative   Not on file   Social Determinants of Health   Financial Resource Strain: Not on file  Food Insecurity: Not on file  Transportation Needs: Not on file  Physical Activity: Not on file  Stress: Not on file  Social Connections: Not on file  Intimate Partner Violence: Not on file      Marcial Pacas, M.D. Ph.D.  [facility] [address], [place], [place] [address] Ph: [phone] Fax: [phone]  CC:  Rosemarie Ax, [place],  [place] [id number]  No primary care provider on file.

## 2021-07-02 ENCOUNTER — Encounter (INDEPENDENT_AMBULATORY_CARE_PROVIDER_SITE_OTHER): Payer: Federal, State, Local not specified - PPO | Admitting: Neurology

## 2021-07-02 ENCOUNTER — Ambulatory Visit (INDEPENDENT_AMBULATORY_CARE_PROVIDER_SITE_OTHER): Payer: Federal, State, Local not specified - PPO | Admitting: Neurology

## 2021-07-02 DIAGNOSIS — R202 Paresthesia of skin: Secondary | ICD-10-CM | POA: Diagnosis not present

## 2021-07-02 DIAGNOSIS — Z0289 Encounter for other administrative examinations: Secondary | ICD-10-CM

## 2021-07-02 DIAGNOSIS — M25521 Pain in right elbow: Secondary | ICD-10-CM

## 2021-07-02 NOTE — Procedures (Signed)
        Full Name: Meagan Cooper Gender: Female MRN #: [id number] Date of Birth: 23-Oct-1975    Visit Date: 07/02/2021 09:36 Age: 45 Years Examining Physician: Levert Feinstein, MD  Referring Physician: Levert Feinstein, MD History: 44 year old female presented with right elbow discomfort, radiating paresthesia to the right fifth finger  Summary of the test, Nerve conduction study: Right median, ulnar sensory and motor responses were normal  Electromyography Selected needle examination of right upper extremity muscles were normal  Conclusion: This is a normal study, there is no electrodiagnostic evidence of right upper extremity neuropathy, in particular there is no evidence of right ulnar neuropathy.    ------------------------------- Levert Feinstein, M.D. PhD  [facility] [address], [address] [place], [place] [address] Tel: [phone] Fax: [phone]  Verbal informed consent was obtained from the patient, patient was informed of potential risk of procedure, including bruising, bleeding, hematoma formation, infection, muscle weakness, muscle pain, numbness, among others.        MNC    Nerve / Sites Muscle Latency Ref. Amplitude Ref. Rel Amp Segments Distance Velocity Ref. Area    ms ms mV mV %  cm m/s m/s mVms  R Median - APB     Wrist APB 3.4 ?4.4 12.3 ?4.0 100 Wrist - APB 7   52.2     Upper arm APB 7.6  11.9  97 Upper arm - Wrist 25 59 ?49 48.8  R Ulnar - ADM     Wrist ADM 2.5 ?3.3 11.6 ?6.0 100 Wrist - ADM 7   44.6     B.Elbow ADM 6.2  10.6  91.4 B.Elbow - Wrist 22 60 ?49 43.0     A.Elbow ADM 8.0  10.6  99.5 A.Elbow - B.Elbow 10 57 ?49 42.9         SNC    Nerve / Sites Rec. Site Peak Lat Ref.  Amp Ref. Segments Distance    ms ms V V  cm  R Median - Orthodromic (Dig II, Mid palm)     Dig II Wrist 2.9 ?3.4 13 ?10 Dig II - Wrist 13  R Ulnar - Orthodromic, (Dig V, Mid palm)     Dig V Wrist 2.5 ?3.1 15 ?5 Dig V - Wrist 44         F  Wave    Nerve F Lat  Ref.   ms ms  R Ulnar - ADM 29.5 ?32.0       EMG Summary Table    Spontaneous MUAP Recruitment  Muscle IA Fib PSW Fasc Other Amp Dur. Poly Pattern  R. First dorsal interosseous Normal None None None _______ Normal Normal Normal Normal  R. Abductor digiti minimi (manus) Normal None None None _______ Normal Normal Normal Normal  R. Flexor carpi ulnaris Normal None None None _______ Normal Normal Normal Normal  R. Pronator teres Normal None None None _______ Normal Normal Normal Normal  R. Biceps brachii Normal None None None _______ Normal Normal Normal Normal  R. Deltoid Normal None None None _______ Normal Normal Normal Normal  R. Triceps brachii Normal None None None _______ Normal Normal Normal Normal  R. Brachioradialis Normal None None None _______ Normal Normal Normal Normal  R. Extensor digitorum communis Normal None None None _______ Normal Normal Normal Normal

## 2022-03-29 IMAGING — DX DG ELBOW COMPLETE 3+V*R*
4 series · 4 of 4 positions shown · non-contrast
Comparison: None.

CLINICAL DATA: Right elbow pain.  Evaluate for fracture.

EXAM:
RIGHT ELBOW - COMPLETE 3+ VIEW

[elbow ap]
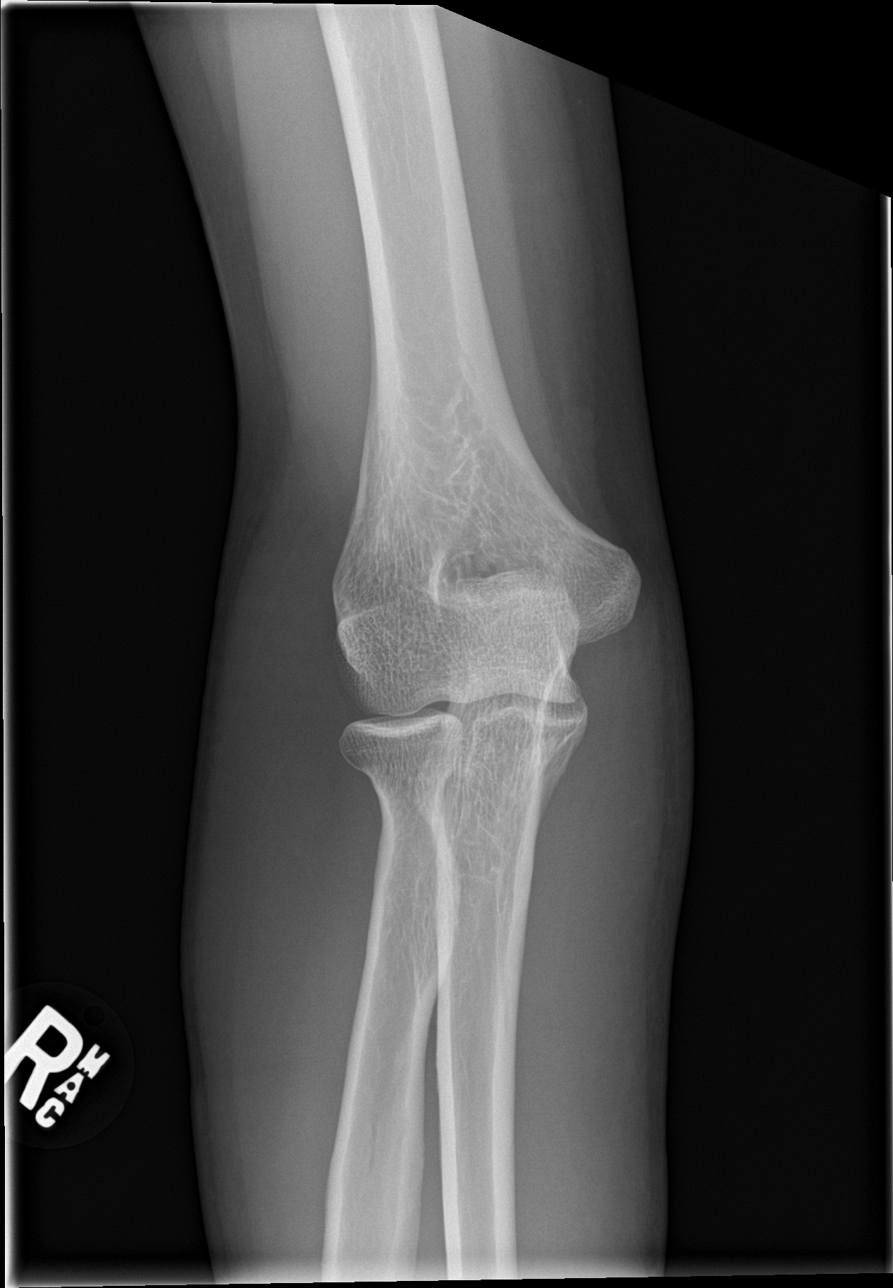

[elbow obl (1 of 2)]
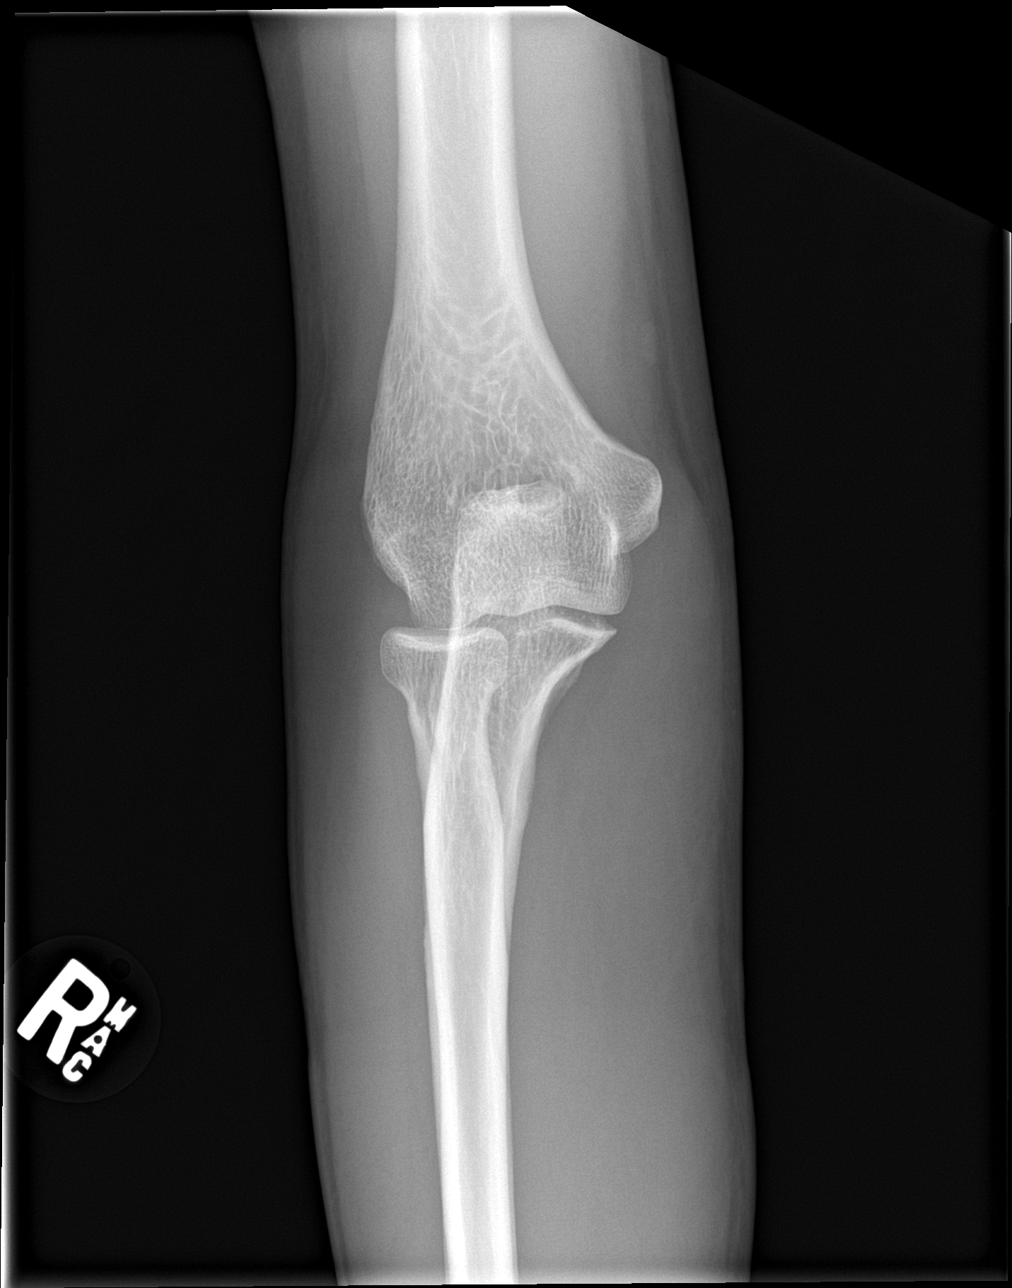

[elbow obl (2 of 2)]
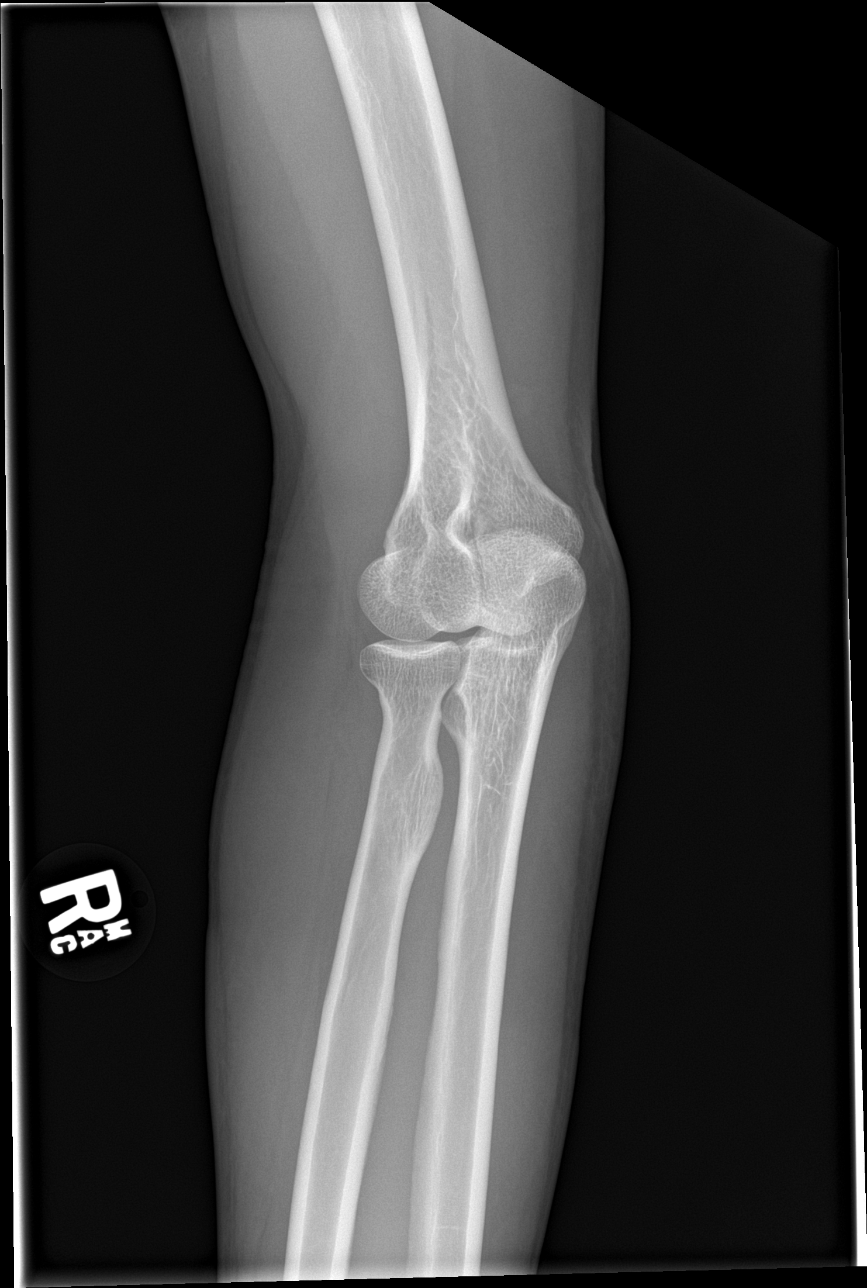

[elbow lat]
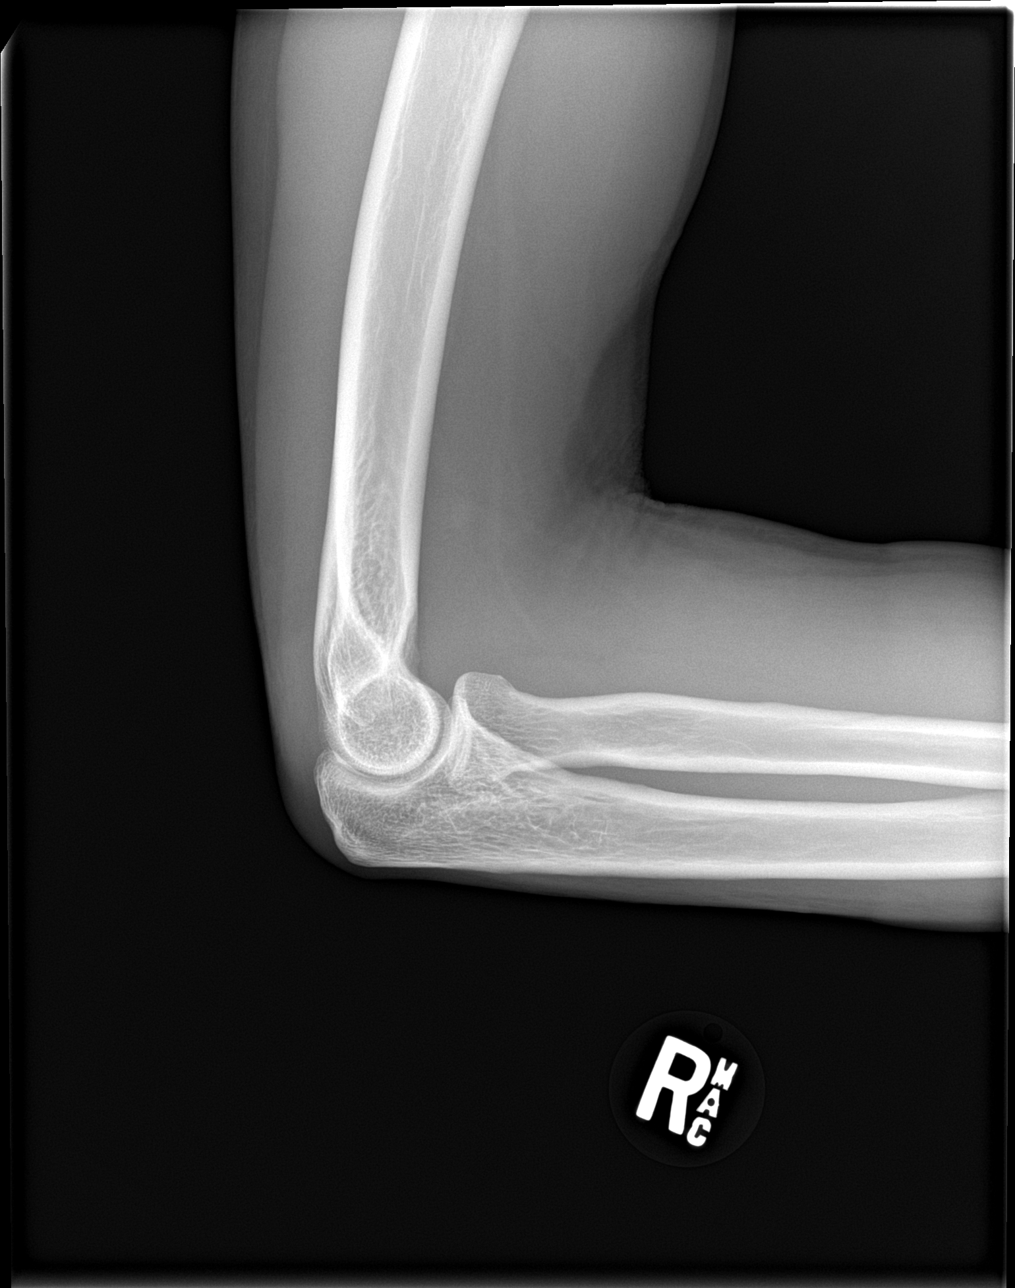

[4 of 4 positions shown; findings below may reference images not displayed]

FINDINGS: There is no evidence of fracture, dislocation, or joint effusion.
There is no evidence of arthropathy or other focal bone abnormality.
Soft tissues are unremarkable.
IMPRESSION: Negative radiographs of the right elbow.

## 2023-01-25 ENCOUNTER — Encounter: Payer: Self-pay | Admitting: *Deleted
